# Patient Record
Sex: Male | Born: 1980
Health system: Southern US, Community
[De-identification: ages and names within clinical notes are randomized; demographics above are authoritative.]

## PROBLEM LIST (undated history)

## (undated) DIAGNOSIS — G8929 Other chronic pain: Secondary | ICD-10-CM

## (undated) DIAGNOSIS — F32A Depression, unspecified: Secondary | ICD-10-CM

## (undated) DIAGNOSIS — F419 Anxiety disorder, unspecified: Secondary | ICD-10-CM

## (undated) DIAGNOSIS — I10 Essential (primary) hypertension: Secondary | ICD-10-CM

## (undated) HISTORY — PX: BACK SURGERY: SHX140

## (undated) HISTORY — PX: TOTAL HIP ARTHROPLASTY: SHX124

---

## 2018-08-19 ENCOUNTER — Other Ambulatory Visit: Payer: Self-pay

## 2018-08-19 ENCOUNTER — Emergency Department (HOSPITAL_COMMUNITY): Payer: BC Managed Care – PPO

## 2018-08-19 ENCOUNTER — Encounter (HOSPITAL_COMMUNITY): Payer: Self-pay | Admitting: Emergency Medicine

## 2018-08-19 ENCOUNTER — Emergency Department (HOSPITAL_COMMUNITY)
Admission: EM | Admit: 2018-08-19 | Discharge: 2018-08-19 | Disposition: A | Discharge status: Home / Self Care | Payer: BC Managed Care – PPO | Attending: Emergency Medicine | Admitting: Emergency Medicine

## 2018-08-19 DIAGNOSIS — Y999 Unspecified external cause status: Secondary | ICD-10-CM | POA: Insufficient documentation

## 2018-08-19 DIAGNOSIS — S43102A Unspecified dislocation of left acromioclavicular joint, initial encounter: Secondary | ICD-10-CM

## 2018-08-19 DIAGNOSIS — W010XXA Fall on same level from slipping, tripping and stumbling without subsequent striking against object, initial encounter: Secondary | ICD-10-CM | POA: Insufficient documentation

## 2018-08-19 DIAGNOSIS — Z96643 Presence of artificial hip joint, bilateral: Secondary | ICD-10-CM | POA: Insufficient documentation

## 2018-08-19 DIAGNOSIS — S43112A Subluxation of left acromioclavicular joint, initial encounter: Secondary | ICD-10-CM | POA: Insufficient documentation

## 2018-08-19 DIAGNOSIS — Y9302 Activity, running: Secondary | ICD-10-CM | POA: Insufficient documentation

## 2018-08-19 DIAGNOSIS — Y9289 Other specified places as the place of occurrence of the external cause: Secondary | ICD-10-CM | POA: Insufficient documentation

## 2018-08-19 DIAGNOSIS — S4992XA Unspecified injury of left shoulder and upper arm, initial encounter: Secondary | ICD-10-CM | POA: Diagnosis not present

## 2018-08-19 MED ORDER — OXYCODONE-ACETAMINOPHEN 5-325 MG PO TABS
2.0000 | ORAL_TABLET | Freq: Once | ORAL | Status: AC
  Administered 2018-08-19: 2 via ORAL
  Filled 2018-08-19: qty 2

## 2018-08-19 NOTE — Discharge Instructions (Signed)
Continue your home pain medication.  Can add 800mg  motrin in between doses if needed.  Can take this up to 3x daily. Follow-up with orthopedics-- Dr. Lyla Glassing is on call so you may follow-up with him or find your own local physician. Return to the ED for new or worsening symptoms.

## 2018-08-19 NOTE — ED Triage Notes (Signed)
Pt presents after fall while running 2-3 hours ago, deformity to L shoulder.

## 2018-08-19 NOTE — ED Notes (Signed)
Pt is upset that he does not need surgery on his shoulder tonight.  Pt reports a lot of pain.  Percocet given to pt.  Pt removed IV and stated he could place a sling on at home.

## 2018-08-19 NOTE — ED Notes (Signed)
Patient verbalizes understanding of discharge instructions. Opportunity for questioning and answers were provided. Armband removed by staff, pt discharged from ED.  Pt asked for the sling he was supposed to get before leaving in the lobby.  Pt was told to wait in the lobby for ortho to come down and place sling on patient.  Pt was very upset and very loud upon discharge.

## 2018-08-19 NOTE — ED Provider Notes (Signed)
[facility] EMERGENCY DEPARTMENT Provider Note   CSN: [id number] Arrival date & time: 08/19/18  2109     History   Chief Complaint Chief Complaint  Patient presents with  . Shoulder Injury    HPI TERREN JANDREAU is a 38 y.o. male.     The history is provided by the patient and medical records.     38 year old male presenting to the ED with left shoulder injury.  He states he was at a family outing and was running and started going to fast, tripped, and landed on his left shoulder.  There was no head injury or loss of consciousness.  States his dad who is a physician tried to mess with his shoulder but it seemed to make it worse.  He feels like it is "popping out of place".  He denies any numbness or weakness of left arm.  He is right-hand dominant.  No meds taken prior to arrival.  History reviewed. No pertinent past medical history.  There are no active problems to display for this patient.   Past Surgical History:  Procedure Laterality Date  . TOTAL HIP ARTHROPLASTY Bilateral         Home Medications    Prior to Admission medications   Not on File    Family History No family history on file.  Social History Social History   Tobacco Use  . Smoking status: Never Smoker  . Smokeless tobacco: Never Used  Substance Use Topics  . Alcohol use: Not Currently  . Drug use: Not Currently    Types: Marijuana     Allergies   Patient has no known allergies.   Review of Systems Review of Systems  Musculoskeletal: Positive for arthralgias.  All other systems reviewed and are negative.    Physical Exam Updated Vital Signs BP 139/85 (BP Location: Right Arm)   Pulse 92   Temp 98.2 F (36.8 C) (Oral)   Resp 18   Ht 6\' 1"  (1.854 m)   SpO2 97%   Physical Exam Vitals signs and nursing note reviewed.  Constitutional:      Appearance: He is well-developed.  HENT:     Head: Normocephalic and atraumatic.  Eyes:     Conjunctiva/sclera:  Conjunctivae normal.     Pupils: Pupils are equal, round, and reactive to light.  Neck:     Musculoskeletal: Normal range of motion.  Cardiovascular:     Rate and Rhythm: Normal rate and regular rhythm.     Heart sounds: Normal heart sounds.  Pulmonary:     Effort: Pulmonary effort is normal.     Breath sounds: Normal breath sounds.  Abdominal:     General: Bowel sounds are normal.     Palpations: Abdomen is soft.  Musculoskeletal: Normal range of motion.     Comments: Left shoulder with deformity noted at the [facility] joint, small abrasion overlying but no open wound or skin tenting, limited ROM of shoulder, full ROM of elbow, wrist, and fingers, normal distal sensation and cap refill  Skin:    General: Skin is warm and dry.  Neurological:     Mental Status: He is alert and oriented to person, place, and time.  Psychiatric:     Comments: agitated      ED Treatments / Results  Labs (all labs ordered are listed, but only abnormal results are displayed) Labs Reviewed - No data to display  EKG None  Radiology Dg Shoulder Left  Result Date: 08/19/2018 CLINICAL  DATA:  Left shoulder pain after fall today. Dislocation. EXAM: LEFT SHOULDER - 2+ VIEW COMPARISON:  None. FINDINGS: Acromioclavicular joint separation with superior elevation of the clavicle above the superior border of the acromion. There is superior displacement of the clavicle. Widening of the cortical clavicular joint space at 27 mm. Slight anterior displacement of the humeral head with respect to the glenoid felt to be related to positioning. IMPRESSION: Acromioclavicular joint separation with widening of the cortical clavicular distance. This may be a grade 3 or 5 injury. Electronically Signed   By: Narda RutherfordMelanie  Sanford M.D.   On: 08/19/2018 23:00    Procedures Procedures (including critical care time)  Medications Ordered in ED Medications - No data to display   Initial Impression / Assessment and Plan / ED Course  I have  reviewed the triage vital signs and the nursing notes.  Pertinent labs & imaging results that were available during my care of the patient were reviewed by me and considered in my medical decision making (see chart for details).  38 year old male here with left shoulder injury.  He was at a family outing with family, was running and lost his footing and fell onto the left shoulder.  No head injury or loss of consciousness.  States he feels like it is dislocated.  His dad who is a physician tried to manipulate it but seem to make it worse.  On exam.  [facility] with small overlying abrasion but no open wound or skin tenting.  Patient continually lifting his shoulder up and moving it around during exam which seems to be making this worse.  I have advised him against this numerous times but continues doing so.  Will place in shoulder sling and have him follow-up with orthopedics.  On controlled substance database system, patient receives chronic narcotics from his primary care doctor, last filled on 07/26/2018.  In light of this will not prescribe further narcotics, have advised him to add anti-inflammatories between dosing if needed.  He was given information for orthopedic physician on call.  He can return here for any new or acute changes.  11:23 PM Patient becoming very irate while here in ED while waiting for shoulder sling.  He states this is "ludacris" and "I never should have come here" but does not state exactly what his issue is.  He seems to have some lack of understanding regarding nature of his injury so have reviewed with him again.  He continues raising his voice in the hallway at staff, ultimately stormed out of the ER with paperwork but did not wait for sling.  Final Clinical Impressions(s) / ED Diagnoses   Final diagnoses:  AC separation, left, initial encounter    ED Discharge Orders    None       Garlon HatchetSanders, Lisa M, PA-C 08/19/18 2335    Virgina Norfolkuratolo,  Adam, DO 08/19/18 2345

## 2018-08-20 DIAGNOSIS — M25512 Pain in left shoulder: Secondary | ICD-10-CM | POA: Diagnosis not present

## 2018-08-21 DIAGNOSIS — H52221 Regular astigmatism, right eye: Secondary | ICD-10-CM | POA: Diagnosis not present

## 2018-08-22 DIAGNOSIS — S43102A Unspecified dislocation of left acromioclavicular joint, initial encounter: Secondary | ICD-10-CM | POA: Diagnosis not present

## 2018-08-22 DIAGNOSIS — M25512 Pain in left shoulder: Secondary | ICD-10-CM | POA: Diagnosis not present

## 2018-09-12 DIAGNOSIS — S43102D Unspecified dislocation of left acromioclavicular joint, subsequent encounter: Secondary | ICD-10-CM | POA: Diagnosis not present

## 2018-09-12 DIAGNOSIS — S43102A Unspecified dislocation of left acromioclavicular joint, initial encounter: Secondary | ICD-10-CM | POA: Diagnosis not present

## 2018-09-28 DIAGNOSIS — F900 Attention-deficit hyperactivity disorder, predominantly inattentive type: Secondary | ICD-10-CM | POA: Diagnosis not present

## 2018-09-28 DIAGNOSIS — Z96643 Presence of artificial hip joint, bilateral: Secondary | ICD-10-CM | POA: Diagnosis not present

## 2018-09-28 DIAGNOSIS — S43122D Dislocation of left acromioclavicular joint, 100%-200% displacement, subsequent encounter: Secondary | ICD-10-CM | POA: Diagnosis not present

## 2018-09-28 DIAGNOSIS — G894 Chronic pain syndrome: Secondary | ICD-10-CM | POA: Diagnosis not present

## 2018-10-10 DIAGNOSIS — S43102A Unspecified dislocation of left acromioclavicular joint, initial encounter: Secondary | ICD-10-CM | POA: Diagnosis not present

## 2018-10-10 DIAGNOSIS — S43102D Unspecified dislocation of left acromioclavicular joint, subsequent encounter: Secondary | ICD-10-CM | POA: Diagnosis not present

## 2019-01-21 DIAGNOSIS — Z20828 Contact with and (suspected) exposure to other viral communicable diseases: Secondary | ICD-10-CM | POA: Diagnosis not present

## 2019-01-31 DIAGNOSIS — M5432 Sciatica, left side: Secondary | ICD-10-CM | POA: Diagnosis not present

## 2019-01-31 DIAGNOSIS — Z79899 Other long term (current) drug therapy: Secondary | ICD-10-CM | POA: Diagnosis not present

## 2019-01-31 DIAGNOSIS — M545 Low back pain: Secondary | ICD-10-CM | POA: Diagnosis not present

## 2019-01-31 DIAGNOSIS — M4716 Other spondylosis with myelopathy, lumbar region: Secondary | ICD-10-CM | POA: Diagnosis not present

## 2019-01-31 DIAGNOSIS — M5431 Sciatica, right side: Secondary | ICD-10-CM | POA: Diagnosis not present

## 2019-02-04 ENCOUNTER — Other Ambulatory Visit: Payer: Self-pay | Admitting: Orthopaedic Surgery

## 2019-02-04 DIAGNOSIS — M4716 Other spondylosis with myelopathy, lumbar region: Secondary | ICD-10-CM

## 2019-02-13 ENCOUNTER — Ambulatory Visit (INDEPENDENT_AMBULATORY_CARE_PROVIDER_SITE_OTHER): Payer: BC Managed Care – PPO | Admitting: Psychology

## 2019-02-13 DIAGNOSIS — F331 Major depressive disorder, recurrent, moderate: Secondary | ICD-10-CM

## 2019-02-21 DIAGNOSIS — S43122D Dislocation of left acromioclavicular joint, 100%-200% displacement, subsequent encounter: Secondary | ICD-10-CM | POA: Diagnosis not present

## 2019-02-21 DIAGNOSIS — F900 Attention-deficit hyperactivity disorder, predominantly inattentive type: Secondary | ICD-10-CM | POA: Diagnosis not present

## 2019-02-21 DIAGNOSIS — Z96643 Presence of artificial hip joint, bilateral: Secondary | ICD-10-CM | POA: Diagnosis not present

## 2019-02-21 DIAGNOSIS — G894 Chronic pain syndrome: Secondary | ICD-10-CM | POA: Diagnosis not present

## 2019-02-22 ENCOUNTER — Other Ambulatory Visit: Payer: Self-pay | Admitting: Orthopaedic Surgery

## 2019-02-26 ENCOUNTER — Other Ambulatory Visit: Payer: Self-pay

## 2019-02-26 ENCOUNTER — Ambulatory Visit
Admission: RE | Admit: 2019-02-26 | Discharge: 2019-02-26 | Disposition: A | Discharge status: Home / Self Care | Payer: BLUE CROSS/BLUE SHIELD | Source: Ambulatory Visit | Attending: Orthopaedic Surgery | Admitting: Orthopaedic Surgery

## 2019-02-26 DIAGNOSIS — M4716 Other spondylosis with myelopathy, lumbar region: Secondary | ICD-10-CM

## 2019-02-26 DIAGNOSIS — M48061 Spinal stenosis, lumbar region without neurogenic claudication: Secondary | ICD-10-CM | POA: Diagnosis not present

## 2019-02-28 ENCOUNTER — Ambulatory Visit (INDEPENDENT_AMBULATORY_CARE_PROVIDER_SITE_OTHER): Payer: BC Managed Care – PPO | Admitting: Psychology

## 2019-02-28 DIAGNOSIS — F331 Major depressive disorder, recurrent, moderate: Secondary | ICD-10-CM | POA: Diagnosis not present

## 2019-02-28 DIAGNOSIS — M47816 Spondylosis without myelopathy or radiculopathy, lumbar region: Secondary | ICD-10-CM | POA: Diagnosis not present

## 2019-02-28 DIAGNOSIS — M48061 Spinal stenosis, lumbar region without neurogenic claudication: Secondary | ICD-10-CM | POA: Diagnosis not present

## 2019-03-06 ENCOUNTER — Ambulatory Visit (INDEPENDENT_AMBULATORY_CARE_PROVIDER_SITE_OTHER): Payer: BC Managed Care – PPO | Admitting: Psychology

## 2019-03-06 DIAGNOSIS — F331 Major depressive disorder, recurrent, moderate: Secondary | ICD-10-CM

## 2019-03-18 DIAGNOSIS — M47816 Spondylosis without myelopathy or radiculopathy, lumbar region: Secondary | ICD-10-CM | POA: Diagnosis not present

## 2019-03-26 DIAGNOSIS — M48061 Spinal stenosis, lumbar region without neurogenic claudication: Secondary | ICD-10-CM | POA: Diagnosis not present

## 2019-03-27 ENCOUNTER — Ambulatory Visit (INDEPENDENT_AMBULATORY_CARE_PROVIDER_SITE_OTHER): Payer: BC Managed Care – PPO | Admitting: Psychology

## 2019-03-27 DIAGNOSIS — F331 Major depressive disorder, recurrent, moderate: Secondary | ICD-10-CM

## 2019-04-16 DIAGNOSIS — M48061 Spinal stenosis, lumbar region without neurogenic claudication: Secondary | ICD-10-CM | POA: Diagnosis not present

## 2019-04-16 DIAGNOSIS — M47816 Spondylosis without myelopathy or radiculopathy, lumbar region: Secondary | ICD-10-CM | POA: Diagnosis not present

## 2019-04-16 DIAGNOSIS — Z6835 Body mass index (BMI) 35.0-35.9, adult: Secondary | ICD-10-CM | POA: Diagnosis not present

## 2019-04-23 ENCOUNTER — Ambulatory Visit: Payer: BC Managed Care – PPO | Admitting: Psychology

## 2019-05-02 DIAGNOSIS — Z79899 Other long term (current) drug therapy: Secondary | ICD-10-CM | POA: Diagnosis not present

## 2019-05-02 DIAGNOSIS — M48062 Spinal stenosis, lumbar region with neurogenic claudication: Secondary | ICD-10-CM | POA: Diagnosis not present

## 2019-05-02 DIAGNOSIS — M545 Low back pain: Secondary | ICD-10-CM | POA: Diagnosis not present

## 2019-05-02 DIAGNOSIS — Z1339 Encounter for screening examination for other mental health and behavioral disorders: Secondary | ICD-10-CM | POA: Diagnosis not present

## 2019-05-02 DIAGNOSIS — M129 Arthropathy, unspecified: Secondary | ICD-10-CM | POA: Diagnosis not present

## 2019-05-02 DIAGNOSIS — Z1331 Encounter for screening for depression: Secondary | ICD-10-CM | POA: Diagnosis not present

## 2019-05-09 ENCOUNTER — Ambulatory Visit (INDEPENDENT_AMBULATORY_CARE_PROVIDER_SITE_OTHER): Payer: BC Managed Care – PPO | Admitting: Psychology

## 2019-05-09 DIAGNOSIS — F331 Major depressive disorder, recurrent, moderate: Secondary | ICD-10-CM | POA: Diagnosis not present

## 2019-05-28 DIAGNOSIS — M48061 Spinal stenosis, lumbar region without neurogenic claudication: Secondary | ICD-10-CM | POA: Diagnosis not present

## 2019-05-30 DIAGNOSIS — Z79899 Other long term (current) drug therapy: Secondary | ICD-10-CM | POA: Diagnosis not present

## 2019-05-30 DIAGNOSIS — Z0000 Encounter for general adult medical examination without abnormal findings: Secondary | ICD-10-CM | POA: Diagnosis not present

## 2019-05-30 DIAGNOSIS — Z131 Encounter for screening for diabetes mellitus: Secondary | ICD-10-CM | POA: Diagnosis not present

## 2019-05-30 DIAGNOSIS — M48062 Spinal stenosis, lumbar region with neurogenic claudication: Secondary | ICD-10-CM | POA: Diagnosis not present

## 2019-05-30 DIAGNOSIS — Z114 Encounter for screening for human immunodeficiency virus [HIV]: Secondary | ICD-10-CM | POA: Diagnosis not present

## 2019-05-30 DIAGNOSIS — M545 Low back pain: Secondary | ICD-10-CM | POA: Diagnosis not present

## 2019-05-30 DIAGNOSIS — F411 Generalized anxiety disorder: Secondary | ICD-10-CM | POA: Diagnosis not present

## 2019-05-30 DIAGNOSIS — D539 Nutritional anemia, unspecified: Secondary | ICD-10-CM | POA: Diagnosis not present

## 2019-05-30 DIAGNOSIS — R5383 Other fatigue: Secondary | ICD-10-CM | POA: Diagnosis not present

## 2019-05-30 DIAGNOSIS — Z20822 Contact with and (suspected) exposure to covid-19: Secondary | ICD-10-CM | POA: Diagnosis not present

## 2019-06-19 ENCOUNTER — Ambulatory Visit (INDEPENDENT_AMBULATORY_CARE_PROVIDER_SITE_OTHER): Payer: BC Managed Care – PPO | Admitting: Psychology

## 2019-06-19 DIAGNOSIS — F331 Major depressive disorder, recurrent, moderate: Secondary | ICD-10-CM

## 2019-06-25 DIAGNOSIS — M5432 Sciatica, left side: Secondary | ICD-10-CM | POA: Diagnosis not present

## 2019-06-25 DIAGNOSIS — M48061 Spinal stenosis, lumbar region without neurogenic claudication: Secondary | ICD-10-CM | POA: Diagnosis not present

## 2019-06-25 DIAGNOSIS — M545 Low back pain: Secondary | ICD-10-CM | POA: Diagnosis not present

## 2019-06-25 DIAGNOSIS — M5431 Sciatica, right side: Secondary | ICD-10-CM | POA: Diagnosis not present

## 2019-07-08 DIAGNOSIS — M5432 Sciatica, left side: Secondary | ICD-10-CM | POA: Diagnosis not present

## 2019-07-17 DIAGNOSIS — M48062 Spinal stenosis, lumbar region with neurogenic claudication: Secondary | ICD-10-CM | POA: Diagnosis not present

## 2019-07-17 DIAGNOSIS — E291 Testicular hypofunction: Secondary | ICD-10-CM | POA: Diagnosis not present

## 2019-07-17 DIAGNOSIS — Z79899 Other long term (current) drug therapy: Secondary | ICD-10-CM | POA: Diagnosis not present

## 2019-07-17 DIAGNOSIS — M545 Low back pain: Secondary | ICD-10-CM | POA: Diagnosis not present

## 2019-08-05 DIAGNOSIS — M5432 Sciatica, left side: Secondary | ICD-10-CM | POA: Diagnosis not present

## 2019-08-05 DIAGNOSIS — M48061 Spinal stenosis, lumbar region without neurogenic claudication: Secondary | ICD-10-CM | POA: Diagnosis not present

## 2019-08-05 DIAGNOSIS — M545 Low back pain: Secondary | ICD-10-CM | POA: Diagnosis not present

## 2019-08-21 DIAGNOSIS — F39 Unspecified mood [affective] disorder: Secondary | ICD-10-CM | POA: Diagnosis not present

## 2019-08-21 DIAGNOSIS — M4800 Spinal stenosis, site unspecified: Secondary | ICD-10-CM | POA: Diagnosis not present

## 2019-08-21 DIAGNOSIS — E669 Obesity, unspecified: Secondary | ICD-10-CM | POA: Diagnosis not present

## 2019-08-21 DIAGNOSIS — F419 Anxiety disorder, unspecified: Secondary | ICD-10-CM | POA: Diagnosis not present

## 2019-08-26 DIAGNOSIS — I10 Essential (primary) hypertension: Secondary | ICD-10-CM | POA: Diagnosis not present

## 2019-08-26 DIAGNOSIS — Z79899 Other long term (current) drug therapy: Secondary | ICD-10-CM | POA: Diagnosis not present

## 2019-08-26 DIAGNOSIS — M545 Low back pain: Secondary | ICD-10-CM | POA: Diagnosis not present

## 2019-08-26 DIAGNOSIS — M48061 Spinal stenosis, lumbar region without neurogenic claudication: Secondary | ICD-10-CM | POA: Diagnosis not present

## 2019-08-26 DIAGNOSIS — Z6835 Body mass index (BMI) 35.0-35.9, adult: Secondary | ICD-10-CM | POA: Diagnosis not present

## 2019-08-26 DIAGNOSIS — M5432 Sciatica, left side: Secondary | ICD-10-CM | POA: Diagnosis not present

## 2019-09-20 DIAGNOSIS — M545 Low back pain: Secondary | ICD-10-CM | POA: Diagnosis not present

## 2019-09-23 DIAGNOSIS — Z6835 Body mass index (BMI) 35.0-35.9, adult: Secondary | ICD-10-CM | POA: Diagnosis not present

## 2019-09-23 DIAGNOSIS — M48061 Spinal stenosis, lumbar region without neurogenic claudication: Secondary | ICD-10-CM | POA: Diagnosis not present

## 2019-09-23 DIAGNOSIS — M5432 Sciatica, left side: Secondary | ICD-10-CM | POA: Diagnosis not present

## 2019-09-23 DIAGNOSIS — M545 Low back pain: Secondary | ICD-10-CM | POA: Diagnosis not present

## 2019-09-30 DIAGNOSIS — M48061 Spinal stenosis, lumbar region without neurogenic claudication: Secondary | ICD-10-CM | POA: Diagnosis not present

## 2019-10-01 ENCOUNTER — Ambulatory Visit: Payer: BC Managed Care – PPO | Admitting: Psychology

## 2019-10-17 DIAGNOSIS — F900 Attention-deficit hyperactivity disorder, predominantly inattentive type: Secondary | ICD-10-CM | POA: Diagnosis not present

## 2019-10-17 DIAGNOSIS — S43122D Dislocation of left acromioclavicular joint, 100%-200% displacement, subsequent encounter: Secondary | ICD-10-CM | POA: Diagnosis not present

## 2019-10-17 DIAGNOSIS — G894 Chronic pain syndrome: Secondary | ICD-10-CM | POA: Diagnosis not present

## 2019-10-17 DIAGNOSIS — I10 Essential (primary) hypertension: Secondary | ICD-10-CM | POA: Diagnosis not present

## 2019-10-24 DIAGNOSIS — M545 Low back pain: Secondary | ICD-10-CM | POA: Diagnosis not present

## 2019-10-24 DIAGNOSIS — Z6835 Body mass index (BMI) 35.0-35.9, adult: Secondary | ICD-10-CM | POA: Diagnosis not present

## 2019-10-24 DIAGNOSIS — Z4689 Encounter for fitting and adjustment of other specified devices: Secondary | ICD-10-CM | POA: Diagnosis not present

## 2019-10-24 DIAGNOSIS — M5432 Sciatica, left side: Secondary | ICD-10-CM | POA: Diagnosis not present

## 2019-10-24 DIAGNOSIS — M48061 Spinal stenosis, lumbar region without neurogenic claudication: Secondary | ICD-10-CM | POA: Diagnosis not present

## 2019-10-24 DIAGNOSIS — M4716 Other spondylosis with myelopathy, lumbar region: Secondary | ICD-10-CM | POA: Diagnosis not present

## 2019-11-01 DIAGNOSIS — E669 Obesity, unspecified: Secondary | ICD-10-CM | POA: Diagnosis not present

## 2019-11-01 DIAGNOSIS — Z23 Encounter for immunization: Secondary | ICD-10-CM | POA: Diagnosis not present

## 2019-11-01 DIAGNOSIS — M4800 Spinal stenosis, site unspecified: Secondary | ICD-10-CM | POA: Diagnosis not present

## 2019-11-01 DIAGNOSIS — Z01818 Encounter for other preprocedural examination: Secondary | ICD-10-CM | POA: Diagnosis not present

## 2019-11-21 DIAGNOSIS — M4316 Spondylolisthesis, lumbar region: Secondary | ICD-10-CM | POA: Diagnosis not present

## 2019-11-21 DIAGNOSIS — Z6835 Body mass index (BMI) 35.0-35.9, adult: Secondary | ICD-10-CM | POA: Diagnosis not present

## 2019-11-21 DIAGNOSIS — M48061 Spinal stenosis, lumbar region without neurogenic claudication: Secondary | ICD-10-CM | POA: Diagnosis not present

## 2019-11-21 DIAGNOSIS — M5432 Sciatica, left side: Secondary | ICD-10-CM | POA: Diagnosis not present

## 2019-11-21 DIAGNOSIS — M545 Low back pain, unspecified: Secondary | ICD-10-CM | POA: Diagnosis not present

## 2019-12-03 DIAGNOSIS — M5106 Intervertebral disc disorders with myelopathy, lumbar region: Secondary | ICD-10-CM | POA: Diagnosis not present

## 2019-12-03 DIAGNOSIS — M4716 Other spondylosis with myelopathy, lumbar region: Secondary | ICD-10-CM | POA: Diagnosis not present

## 2019-12-03 DIAGNOSIS — M961 Postlaminectomy syndrome, not elsewhere classified: Secondary | ICD-10-CM | POA: Diagnosis not present

## 2019-12-03 DIAGNOSIS — M48061 Spinal stenosis, lumbar region without neurogenic claudication: Secondary | ICD-10-CM | POA: Diagnosis not present

## 2019-12-03 DIAGNOSIS — M4316 Spondylolisthesis, lumbar region: Secondary | ICD-10-CM | POA: Diagnosis not present

## 2019-12-06 DIAGNOSIS — M5106 Intervertebral disc disorders with myelopathy, lumbar region: Secondary | ICD-10-CM | POA: Diagnosis not present

## 2019-12-06 DIAGNOSIS — M4316 Spondylolisthesis, lumbar region: Secondary | ICD-10-CM | POA: Diagnosis not present

## 2019-12-06 DIAGNOSIS — Z01812 Encounter for preprocedural laboratory examination: Secondary | ICD-10-CM | POA: Diagnosis not present

## 2019-12-06 DIAGNOSIS — M4716 Other spondylosis with myelopathy, lumbar region: Secondary | ICD-10-CM | POA: Diagnosis not present

## 2019-12-06 DIAGNOSIS — M961 Postlaminectomy syndrome, not elsewhere classified: Secondary | ICD-10-CM | POA: Diagnosis not present

## 2019-12-10 DIAGNOSIS — M4716 Other spondylosis with myelopathy, lumbar region: Secondary | ICD-10-CM | POA: Diagnosis not present

## 2019-12-10 DIAGNOSIS — M5106 Intervertebral disc disorders with myelopathy, lumbar region: Secondary | ICD-10-CM | POA: Diagnosis not present

## 2019-12-10 DIAGNOSIS — M4807 Spinal stenosis, lumbosacral region: Secondary | ICD-10-CM | POA: Diagnosis not present

## 2019-12-10 DIAGNOSIS — M5136 Other intervertebral disc degeneration, lumbar region: Secondary | ICD-10-CM | POA: Diagnosis not present

## 2019-12-10 DIAGNOSIS — M48061 Spinal stenosis, lumbar region without neurogenic claudication: Secondary | ICD-10-CM | POA: Diagnosis not present

## 2019-12-10 DIAGNOSIS — M4727 Other spondylosis with radiculopathy, lumbosacral region: Secondary | ICD-10-CM | POA: Diagnosis not present

## 2019-12-10 DIAGNOSIS — M4316 Spondylolisthesis, lumbar region: Secondary | ICD-10-CM | POA: Diagnosis not present

## 2019-12-10 DIAGNOSIS — Z981 Arthrodesis status: Secondary | ICD-10-CM | POA: Diagnosis not present

## 2019-12-10 DIAGNOSIS — M5127 Other intervertebral disc displacement, lumbosacral region: Secondary | ICD-10-CM | POA: Diagnosis not present

## 2019-12-10 DIAGNOSIS — G8918 Other acute postprocedural pain: Secondary | ICD-10-CM | POA: Diagnosis not present

## 2019-12-10 DIAGNOSIS — M4317 Spondylolisthesis, lumbosacral region: Secondary | ICD-10-CM | POA: Diagnosis not present

## 2019-12-10 DIAGNOSIS — M961 Postlaminectomy syndrome, not elsewhere classified: Secondary | ICD-10-CM | POA: Diagnosis not present

## 2019-12-11 DIAGNOSIS — M5127 Other intervertebral disc displacement, lumbosacral region: Secondary | ICD-10-CM | POA: Diagnosis not present

## 2019-12-11 DIAGNOSIS — M4326 Fusion of spine, lumbar region: Secondary | ICD-10-CM | POA: Diagnosis not present

## 2019-12-11 DIAGNOSIS — M4807 Spinal stenosis, lumbosacral region: Secondary | ICD-10-CM | POA: Diagnosis not present

## 2019-12-11 DIAGNOSIS — M4317 Spondylolisthesis, lumbosacral region: Secondary | ICD-10-CM | POA: Diagnosis not present

## 2019-12-11 DIAGNOSIS — R531 Weakness: Secondary | ICD-10-CM | POA: Diagnosis not present

## 2019-12-11 DIAGNOSIS — M4316 Spondylolisthesis, lumbar region: Secondary | ICD-10-CM | POA: Diagnosis not present

## 2019-12-11 DIAGNOSIS — M4727 Other spondylosis with radiculopathy, lumbosacral region: Secondary | ICD-10-CM | POA: Diagnosis not present

## 2019-12-11 DIAGNOSIS — M961 Postlaminectomy syndrome, not elsewhere classified: Secondary | ICD-10-CM | POA: Diagnosis not present

## 2019-12-11 DIAGNOSIS — Z981 Arthrodesis status: Secondary | ICD-10-CM | POA: Diagnosis not present

## 2020-01-08 DIAGNOSIS — M4316 Spondylolisthesis, lumbar region: Secondary | ICD-10-CM | POA: Diagnosis not present

## 2020-01-20 DIAGNOSIS — Z0000 Encounter for general adult medical examination without abnormal findings: Secondary | ICD-10-CM | POA: Diagnosis not present

## 2020-02-05 DIAGNOSIS — M4316 Spondylolisthesis, lumbar region: Secondary | ICD-10-CM | POA: Diagnosis not present

## 2020-02-05 DIAGNOSIS — Z981 Arthrodesis status: Secondary | ICD-10-CM | POA: Diagnosis not present

## 2020-02-18 DIAGNOSIS — Z79891 Long term (current) use of opiate analgesic: Secondary | ICD-10-CM | POA: Diagnosis not present

## 2020-02-18 DIAGNOSIS — M4326 Fusion of spine, lumbar region: Secondary | ICD-10-CM | POA: Diagnosis not present

## 2020-02-18 DIAGNOSIS — Z79899 Other long term (current) drug therapy: Secondary | ICD-10-CM | POA: Diagnosis not present

## 2020-02-18 DIAGNOSIS — G894 Chronic pain syndrome: Secondary | ICD-10-CM | POA: Diagnosis not present

## 2020-03-18 DIAGNOSIS — M545 Low back pain, unspecified: Secondary | ICD-10-CM | POA: Diagnosis not present

## 2020-03-18 DIAGNOSIS — M4716 Other spondylosis with myelopathy, lumbar region: Secondary | ICD-10-CM | POA: Diagnosis not present

## 2020-03-18 DIAGNOSIS — M4326 Fusion of spine, lumbar region: Secondary | ICD-10-CM | POA: Diagnosis not present

## 2020-04-03 DIAGNOSIS — E291 Testicular hypofunction: Secondary | ICD-10-CM | POA: Diagnosis not present

## 2020-04-16 DIAGNOSIS — M4326 Fusion of spine, lumbar region: Secondary | ICD-10-CM | POA: Diagnosis not present

## 2020-04-16 DIAGNOSIS — M545 Low back pain, unspecified: Secondary | ICD-10-CM | POA: Diagnosis not present

## 2020-05-14 DIAGNOSIS — M4326 Fusion of spine, lumbar region: Secondary | ICD-10-CM | POA: Diagnosis not present

## 2020-05-14 DIAGNOSIS — M545 Low back pain, unspecified: Secondary | ICD-10-CM | POA: Diagnosis not present

## 2020-06-16 DIAGNOSIS — M545 Low back pain, unspecified: Secondary | ICD-10-CM | POA: Diagnosis not present

## 2020-06-16 DIAGNOSIS — Z6835 Body mass index (BMI) 35.0-35.9, adult: Secondary | ICD-10-CM | POA: Diagnosis not present

## 2020-06-16 DIAGNOSIS — M4326 Fusion of spine, lumbar region: Secondary | ICD-10-CM | POA: Diagnosis not present

## 2020-07-07 DIAGNOSIS — R5383 Other fatigue: Secondary | ICD-10-CM | POA: Diagnosis not present

## 2020-07-07 DIAGNOSIS — E291 Testicular hypofunction: Secondary | ICD-10-CM | POA: Diagnosis not present

## 2020-07-20 DIAGNOSIS — M4326 Fusion of spine, lumbar region: Secondary | ICD-10-CM | POA: Diagnosis not present

## 2020-07-20 DIAGNOSIS — M545 Low back pain, unspecified: Secondary | ICD-10-CM | POA: Diagnosis not present

## 2020-07-20 DIAGNOSIS — I10 Essential (primary) hypertension: Secondary | ICD-10-CM | POA: Diagnosis not present

## 2020-07-20 DIAGNOSIS — Z6835 Body mass index (BMI) 35.0-35.9, adult: Secondary | ICD-10-CM | POA: Diagnosis not present

## 2020-07-24 DIAGNOSIS — M4800 Spinal stenosis, site unspecified: Secondary | ICD-10-CM | POA: Diagnosis not present

## 2020-08-13 ENCOUNTER — Ambulatory Visit: Payer: BC Managed Care – PPO | Admitting: Behavioral Health

## 2020-08-18 DIAGNOSIS — M4326 Fusion of spine, lumbar region: Secondary | ICD-10-CM | POA: Diagnosis not present

## 2020-08-18 DIAGNOSIS — M545 Low back pain, unspecified: Secondary | ICD-10-CM | POA: Diagnosis not present

## 2020-08-18 DIAGNOSIS — Z79899 Other long term (current) drug therapy: Secondary | ICD-10-CM | POA: Diagnosis not present

## 2020-08-27 ENCOUNTER — Encounter: Payer: Self-pay | Admitting: Behavioral Health

## 2020-08-27 ENCOUNTER — Ambulatory Visit (INDEPENDENT_AMBULATORY_CARE_PROVIDER_SITE_OTHER): Payer: BC Managed Care – PPO | Admitting: Behavioral Health

## 2020-08-27 ENCOUNTER — Other Ambulatory Visit: Payer: Self-pay

## 2020-08-27 VITALS — BP 151/87 | HR 92 | Ht 73.0 in | Wt 254.0 lb

## 2020-08-27 DIAGNOSIS — F902 Attention-deficit hyperactivity disorder, combined type: Secondary | ICD-10-CM | POA: Diagnosis not present

## 2020-08-27 DIAGNOSIS — F319 Bipolar disorder, unspecified: Secondary | ICD-10-CM

## 2020-08-27 DIAGNOSIS — F331 Major depressive disorder, recurrent, moderate: Secondary | ICD-10-CM

## 2020-08-27 DIAGNOSIS — F411 Generalized anxiety disorder: Secondary | ICD-10-CM | POA: Diagnosis not present

## 2020-08-27 MED ORDER — CARIPRAZINE HCL 3 MG PO CAPS: 3.0000 mg | ORAL_CAPSULE | Freq: Every day | ORAL | 1 refills | Status: DC

## 2020-08-27 MED ORDER — AMPHETAMINE-DEXTROAMPHET ER 30 MG PO CP24: 30.0000 mg | ORAL_CAPSULE | Freq: Every day | ORAL | 0 refills | Status: DC

## 2020-08-27 MED ORDER — CLONIDINE HCL 0.1 MG PO TABS: 0.1000 mg | ORAL_TABLET | Freq: Two times a day (BID) | ORAL | 3 refills | Status: DC

## 2020-08-27 MED ORDER — AMPHETAMINE-DEXTROAMPHETAMINE 20 MG PO TABS: 20.0000 mg | ORAL_TABLET | Freq: Every day | ORAL | 0 refills | Status: DC

## 2020-08-27 NOTE — Progress Notes (Addendum)
[facility] MD/PA/NP Initial Note  08/28/2020 12:24 AM Oscar Weber  MRN:  [id number]  Chief Complaint:  Chief Complaint   ADHD; Establish Care; Anxiety; Depression; Medication Refill; Medication Problem; Patient Education     HPI:  40 year old male presents to this office for initial visit and to establish care. His spouse is present with his consent. He says that he is former patient of Dr. Alyse Low in [place]. Says that he has struggled with ADHD, mood disorder, and anxiety for over 20 years. His wife says that he is better when he takes his medication correctly and says Depakote has worked to some degree with his mood swings. He says that he stays more depressed than manice episodes. He experiences manic episodes cycling about every 3 months. When he becomes manic he says that after about 1 week,  he eventually crashes back in to depression, and wants to sleep all the time. He says that he has tried many different medications over the years and none have worked well for long periods. He says that he also was written Klonopin 0.5 mg 4 times daily, along with Ativan 1 mg  4 times daily. He says that he is addicted to it and knows that he has developed tolerance. Says he does not want to go through withdrawal though. He would like to try new medication to help with his depressive episode and anxiety. Once more stable he agrees to wean off some medications. He reports anxiety today at 6/10 and depression 5/10. Says he sleeps 7-8 hours per night with assistance of medications. No current mania, no psychosis. No SI/HI  Past psychiatric medication trials: Zoloft Prozac Celexa Lexapro Paxil Wellbutrin Remeron Seroquel Geodon Depakote     Visit Diagnosis:    ICD-10-CM   1. Bipolar I disorder (HCC)  F31.9     2. Attention deficit hyperactivity disorder (ADHD), combined type  F90.2 amphetamine-dextroamphetamine (ADDERALL XR) 30 MG 24 hr capsule    cloNIDine (CATAPRES) 0.1 MG  tablet    amphetamine-dextroamphetamine (ADDERALL) 20 MG tablet    3. Generalized anxiety disorder  F41.1 cloNIDine (CATAPRES) 0.1 MG tablet    cariprazine (VRAYLAR) 3 MG capsule    4. Major depressive disorder, recurrent episode, moderate (HCC)  F33.1 cariprazine (VRAYLAR) 3 MG capsule    5. Bipolar depression (HCC)  F31.9 cariprazine (VRAYLAR) 3 MG capsule      Past Psychiatric History:   Past Medical History: History reviewed. No pertinent past medical history.  Past Surgical History:  Procedure Laterality Date   TOTAL HIP ARTHROPLASTY Bilateral     Family Psychiatric History:None noted this visit  Family History: History reviewed. No pertinent family history.  Social History:  Social History   Socioeconomic History   Marital status: Married    Spouse name: Not on file   Number of children: Not on file   Years of education: Not on file   Highest education level: Not on file  Occupational History    Comment: [employer]  Tobacco Use   Smoking status: Never   Smokeless tobacco: Never  Vaping Use   Vaping Use: Never used  Substance and Sexual Activity   Alcohol use: Not Currently    Comment: rarely   Drug use: Yes    Types: Marijuana   Sexual activity: Yes    Comment: married  Other Topics Concern   Not on file  Social History Narrative   Lives with wife in [place]. They have one daughter at home.  Social Determinants of Health   Financial Resource Strain: Not on file  Food Insecurity: Not on file  Transportation Needs: Not on file  Physical Activity: Not on file  Stress: Not on file  Social Connections: Not on file    Allergies: Not on File  Metabolic Disorder Labs: No results found for: HGBA1C, MPG No results found for: PROLACTIN No results found for: CHOL, TRIG, HDL, CHOLHDL, VLDL, LDLCALC No results found for: TSH  Therapeutic Level Labs: No results found for: LITHIUM No results found for: VALPROATE No components found for:   CBMZ  Current Medications: Current Outpatient Medications  Medication Sig Dispense Refill   albuterol (VENTOLIN HFA) 108 (90 Base) MCG/ACT inhaler Inhale into the lungs.     amphetamine-dextroamphetamine (ADDERALL XR) 30 MG 24 hr capsule Take 1 capsule (30 mg total) by mouth daily. 30 capsule 0   amphetamine-dextroamphetamine (ADDERALL) 20 MG tablet Take 1 tablet (20 mg total) by mouth daily. 30 tablet 0   cariprazine (VRAYLAR) 3 MG capsule Take 1 capsule (3 mg total) by mouth daily. 30 capsule 1   cloNIDine (CATAPRES) 0.1 MG tablet Take 1 tablet (0.1 mg total) by mouth 2 (two) times daily. 60 tablet 3   amphetamine-dextroamphetamine (ADDERALL XR) 30 MG 24 hr capsule Take 30 mg by mouth every morning.     amphetamine-dextroamphetamine (ADDERALL) 20 MG tablet Take by mouth.     celecoxib (CELEBREX) 200 MG capsule Take 200 mg by mouth 2 (two) times daily as needed.     clonazePAM (KLONOPIN) 0.5 MG tablet Take 0.5 mg by mouth 4 (four) times daily.     divalproex (DEPAKOTE ER) 500 MG 24 hr tablet Take 1,500 mg by mouth at bedtime.     gabapentin (NEURONTIN) 300 MG capsule SMARTSIG:2-3 Capsule(s) By Mouth 3 Times Daily     oxyCODONE-acetaminophen (PERCOCET) 10-325 MG tablet Take 1 tablet by mouth every 6 (six) hours as needed.     sertraline (ZOLOFT) 100 MG tablet Take 200 mg by mouth every morning.     triamcinolone cream (KENALOG) 0.1 % Apply topically 2 (two) times daily.     No current facility-administered medications for this visit.    Medication Side Effects: none  Orders placed this visit:  No orders of the defined types were placed in this encounter.   Psychiatric Specialty Exam:  Review of Systems  Constitutional:  Positive for diaphoresis and fatigue.  Eyes:  Positive for visual disturbance.  Endocrine: Positive for polydipsia.  Musculoskeletal:  Positive for back pain, joint swelling, myalgias and neck pain.  Allergic/Immunologic: Positive for environmental allergies.   Hematological:  Bruises/bleeds easily.   Blood pressure (!) 151/87, pulse 92, height 6\' 1"  (1.854 m), weight 254 lb (115.2 kg).Body mass index is 33.51 kg/m.  General Appearance: Casual and Neat  Eye Contact:  Fair  Speech:  Clear and Coherent and Pressured  Volume:  Normal  Mood:  Anxious, Depressed, and Dysphoric  Affect:  Appropriate, Restricted, and Tearful  Thought Process:  Coherent  Orientation:  Full (Time, Place, and Person)  Thought Content: Logical   Suicidal Thoughts:  No  Homicidal Thoughts:  No  Memory:  WNL  Judgement:  Fair  Insight:  Fair  Psychomotor Activity:  Increased  Concentration:  Concentration: Fair  Recall:  Good  Fund of Knowledge: Fair  Language: Fair  Assets:  Desire for Improvement  ADL's:  Intact  Cognition: WNL  Prognosis:  Fair   Screenings:   Visit from 08/27/2020 in [facility]  PHQ-2 Total Score 1       Receiving Psychotherapy: No   Treatment Plan/Recommendations:  Start Vraylar 1.5 mg tablet for 14 days, then 3 mg tablet daily. Stop Seroquel 25 mg at bedtime To continue Adderall 30 mg XR  daily Continue Adderall 20 mg IR daily in afternoon Continue Clonidine 0.1 mg twice daily Continue Klonopin 0.5 mg 4 times daily Continue Depakote  1500 mg at bedtime Continue Gabapentin 300 mg three times daily Greater than 50% of  60 min face to face time with patient was spent on counseling and coordination of care. We discussed his long history of bipolar 1  disorder along with bipolar depression. Also discussed his long term use of benzodiazapine's and duplicate therapy. Pt was prescribed Klonopin and Ativan multiple doses per day. I explained that I was not comfortable with his previous scripts for both of these meds. Discussed developing plan once stabilized to wean down or completely off benzo's. Pt has been prescribed for over 20 years and has developed tolerance. Weaning will have to be  conducted slowly and safely.  Reinforced potential benefits, risk, and side effects of benzodiazepines to include potential risk of tolerance and dependence, as well as possible drowsiness.  Advised patient not to drive if experiencing drowsiness and to take lowest possible effective dose to minimize risk of dependence and tolerance.  Discussed potential benefits, risks, and side effects of stimulants with patient to include increased heart rate, palpitations, insomnia, increased anxiety, increased irritability, or decreased appetite.  Instructed patient to contact office if experiencing any significant tolerability issues.  Discussed potential metabolic side effects associated with atypical antipsychotics, as well as potential risk for movement side effects. Advised pt to contact office if movement side effects occur.   Provide Samples Vraylar  # 14 1.5 mg tablets #14 3 mg tablets.           Joan Flores, NP

## 2020-08-28 ENCOUNTER — Encounter: Payer: Self-pay | Admitting: Behavioral Health

## 2020-09-14 DIAGNOSIS — Z79891 Long term (current) use of opiate analgesic: Secondary | ICD-10-CM | POA: Diagnosis not present

## 2020-09-14 DIAGNOSIS — Z79899 Other long term (current) drug therapy: Secondary | ICD-10-CM | POA: Diagnosis not present

## 2020-09-14 DIAGNOSIS — M961 Postlaminectomy syndrome, not elsewhere classified: Secondary | ICD-10-CM | POA: Diagnosis not present

## 2020-09-14 DIAGNOSIS — M5136 Other intervertebral disc degeneration, lumbar region: Secondary | ICD-10-CM | POA: Diagnosis not present

## 2020-09-14 DIAGNOSIS — M25512 Pain in left shoulder: Secondary | ICD-10-CM | POA: Diagnosis not present

## 2020-09-14 DIAGNOSIS — G894 Chronic pain syndrome: Secondary | ICD-10-CM | POA: Diagnosis not present

## 2020-09-18 ENCOUNTER — Telehealth: Payer: Self-pay

## 2020-09-18 NOTE — Telephone Encounter (Signed)
Prior Authorization submitted and approved for VRAYLAR 3 MG effective 09/18/2020-09/17/2021 with BCBS ID# [id number]

## 2020-09-24 ENCOUNTER — Other Ambulatory Visit: Payer: Self-pay

## 2020-09-24 ENCOUNTER — Ambulatory Visit (INDEPENDENT_AMBULATORY_CARE_PROVIDER_SITE_OTHER): Payer: BC Managed Care – PPO | Admitting: Behavioral Health

## 2020-09-24 ENCOUNTER — Encounter: Payer: Self-pay | Admitting: Behavioral Health

## 2020-09-24 DIAGNOSIS — F411 Generalized anxiety disorder: Secondary | ICD-10-CM | POA: Diagnosis not present

## 2020-09-24 DIAGNOSIS — R5382 Chronic fatigue, unspecified: Secondary | ICD-10-CM

## 2020-09-24 DIAGNOSIS — F331 Major depressive disorder, recurrent, moderate: Secondary | ICD-10-CM

## 2020-09-24 DIAGNOSIS — F902 Attention-deficit hyperactivity disorder, combined type: Secondary | ICD-10-CM | POA: Diagnosis not present

## 2020-09-24 DIAGNOSIS — F319 Bipolar disorder, unspecified: Secondary | ICD-10-CM

## 2020-09-24 DIAGNOSIS — I10 Essential (primary) hypertension: Secondary | ICD-10-CM | POA: Diagnosis not present

## 2020-09-24 DIAGNOSIS — Z79899 Other long term (current) drug therapy: Secondary | ICD-10-CM

## 2020-09-24 DIAGNOSIS — E291 Testicular hypofunction: Secondary | ICD-10-CM | POA: Diagnosis not present

## 2020-09-24 MED ORDER — CARIPRAZINE HCL 3 MG PO CAPS: 3.0000 mg | ORAL_CAPSULE | Freq: Every day | ORAL | 3 refills | Status: DC

## 2020-09-24 NOTE — Progress Notes (Signed)
[facility] Med Check  Patient ID: JOCOB DAMBACH,  MRN: [id number]  PCP: Charlane Ferretti, DO  Date of Evaluation: 09/24/2020 Time spent:30 minutes  Chief Complaint:  Chief Complaint   Anxiety; Follow-up; Medication Refill; Manic Behavior     HISTORY/CURRENT STATUS: HPI 40 year old male presents to this office for follow up and medication management. He says that he is feeling so much better with the Vraylar 3 mg. Says that even though he has some irritability and agitation, he feels much more balanced and controlled. He says that his wife also sees the  improvement with moods. He says that he feels his thinking is more clear and he does not feel as sluggish. Says he would like to continue to wean off Depakote and Ativan as progress permits.  He reports his anxiety today is 3/10 and depression 3/10. He is sleeping 7-8 hours per night. No mania present, no psychosis. No SI/HI.  Past psychiatric medication trials: Zoloft Prozac Celexa Lexapro Paxil Wellbutrin Remeron Seroquel Geodon Depakote     Individual Medical History/ Review of Systems: Changes? :No   Allergies: Patient has no allergy information on record.  Current Medications:  Current Outpatient Medications:    albuterol (VENTOLIN HFA) 108 (90 Base) MCG/ACT inhaler, Inhale into the lungs., Disp: , Rfl:    amphetamine-dextroamphetamine (ADDERALL XR) 30 MG 24 hr capsule, Take 30 mg by mouth every morning., Disp: , Rfl:    amphetamine-dextroamphetamine (ADDERALL XR) 30 MG 24 hr capsule, Take 1 capsule (30 mg total) by mouth daily., Disp: 30 capsule, Rfl: 0   amphetamine-dextroamphetamine (ADDERALL) 20 MG tablet, Take by mouth., Disp: , Rfl:    amphetamine-dextroamphetamine (ADDERALL) 20 MG tablet, Take 1 tablet (20 mg total) by mouth daily., Disp: 30 tablet, Rfl: 0   cariprazine (VRAYLAR) 3 MG capsule, Take 1 capsule (3 mg total) by mouth daily., Disp: 30 capsule, Rfl: 3   celecoxib (CELEBREX) 200 MG capsule, Take  200 mg by mouth 2 (two) times daily as needed., Disp: , Rfl:    cloNIDine (CATAPRES) 0.1 MG tablet, Take 1 tablet (0.1 mg total) by mouth 2 (two) times daily., Disp: 60 tablet, Rfl: 3   divalproex (DEPAKOTE ER) 500 MG 24 hr tablet, Take 1,500 mg by mouth at bedtime., Disp: , Rfl:    gabapentin (NEURONTIN) 300 MG capsule, SMARTSIG:2-3 Capsule(s) By Mouth 3 Times Daily, Disp: , Rfl:    oxyCODONE-acetaminophen (PERCOCET) 10-325 MG tablet, Take 1 tablet by mouth every 6 (six) hours as needed., Disp: , Rfl:    sertraline (ZOLOFT) 100 MG tablet, Take 200 mg by mouth every morning., Disp: , Rfl:    triamcinolone cream (KENALOG) 0.1 %, Apply topically 2 (two) times daily., Disp: , Rfl:  Medication Side Effects: none  Family Medical/ Social History: Changes? No  MENTAL HEALTH EXAM:  There were no vitals taken for this visit.There is no height or weight on file to calculate BMI.  General Appearance: Casual, Neat, and Well Groomed  Eye Contact:  Good  Speech:  Clear and Coherent  Volume:  Increased  Mood:  Anxious  Affect:  Appropriate  Thought Process:  Coherent  Orientation:  Full (Time, Place, and Person)  Thought Content: Logical   Suicidal Thoughts:  No  Homicidal Thoughts:  No  Memory:  WNL  Judgement:  Fair  Insight:  Good  Psychomotor Activity:  Increased  Concentration:  Concentration: Fair  Recall:  Good  Fund of Knowledge: Good  Language: Good  Assets:  Desire for Improvement  ADL's:  Intact  Cognition: WNL  Prognosis:  Good    DIAGNOSES:    ICD-10-CM   1. Bipolar I disorder (HCC)  F31.9     2. Generalized anxiety disorder  F41.1 cariprazine (VRAYLAR) 3 MG capsule    3. Major depressive disorder, recurrent episode, moderate (HCC)  F33.1 cariprazine (VRAYLAR) 3 MG capsule    4. Bipolar depression (HCC)  F31.9 cariprazine (VRAYLAR) 3 MG capsule    5. Attention deficit hyperactivity disorder (ADHD), combined type  F90.2       Receiving Psychotherapy: No     RECOMMENDATIONS:   Treatment Plan/Recommendations:  Continue Vraylar 3 mg daily. Refills provided. Stop Seroquel 25 mg at bedtime To continue Adderall 30 mg XR  daily Continue Adderall 20 mg IR daily in afternoon Stop Clonidine  Stopped Klonopin To reduce Depakote by 250 mg for two weeks and then stay at 1000 mg at bedtime until next f/u  Continue Gabapentin 300 mg three times daily Greater than 50% of  60 min face to face time with patient was spent on counseling and coordination of care. We discussed his recent improvement in moods as well as irritability and agitation. He Reinforced again and discussed his long term use of benzodiazapine's. Told him we would continue to plan for weaning off the last remaining benzo Ativan in months to come if patient continues improvement. Reinforced potential benefits, risk, and side effects of benzodiazepines to include potential risk of tolerance and dependence, as well as possible drowsiness.  Advised patient not to drive if experiencing drowsiness and to take lowest possible effective dose to minimize risk of dependence and tolerance.  Discussed potential benefits, risks, and side effects of stimulants with patient to include increased heart rate, palpitations, insomnia, increased anxiety, increased irritability, or decreased appetite.  Instructed patient to contact office if experiencing any significant tolerability issues.  Discussed potential metabolic side effects associated with atypical antipsychotics, as well as potential risk for movement side effects. Advised pt to contact office if movement side effects occur.

## 2020-10-05 ENCOUNTER — Other Ambulatory Visit: Payer: Self-pay | Admitting: Behavioral Health

## 2020-10-05 DIAGNOSIS — F902 Attention-deficit hyperactivity disorder, combined type: Secondary | ICD-10-CM

## 2020-10-06 ENCOUNTER — Telehealth: Payer: Self-pay | Admitting: Behavioral Health

## 2020-10-06 NOTE — Telephone Encounter (Signed)
Pt needs PA for Adderall XR 30 mg generic. Has new insurance eff 08/14/20. System updated BCBS.

## 2020-10-06 NOTE — Telephone Encounter (Signed)
Last filled 7/22 appt on 11/11

## 2020-10-07 DIAGNOSIS — Z79899 Other long term (current) drug therapy: Secondary | ICD-10-CM | POA: Diagnosis not present

## 2020-10-07 DIAGNOSIS — R5382 Chronic fatigue, unspecified: Secondary | ICD-10-CM | POA: Diagnosis not present

## 2020-10-07 DIAGNOSIS — F331 Major depressive disorder, recurrent, moderate: Secondary | ICD-10-CM | POA: Diagnosis not present

## 2020-10-07 NOTE — Telephone Encounter (Signed)
Rx sent 

## 2020-10-11 LAB — TESTOSTERONE, FREE, TOTAL, SHBG
Sex Hormone Binding: 39.2 nmol/L (ref 16.5–55.9)
Testosterone, Free: 8 pg/mL — ABNORMAL LOW (ref 8.7–25.1)
Testosterone: 169 ng/dL — ABNORMAL LOW (ref 264–916)

## 2020-10-11 LAB — VALPROIC ACID LEVEL: Valproic Acid Lvl: 49 ug/mL — ABNORMAL LOW (ref 50–100)

## 2020-10-12 DIAGNOSIS — Z981 Arthrodesis status: Secondary | ICD-10-CM | POA: Diagnosis not present

## 2020-10-12 DIAGNOSIS — M5136 Other intervertebral disc degeneration, lumbar region: Secondary | ICD-10-CM | POA: Diagnosis not present

## 2020-10-12 DIAGNOSIS — G894 Chronic pain syndrome: Secondary | ICD-10-CM | POA: Diagnosis not present

## 2020-10-12 DIAGNOSIS — M961 Postlaminectomy syndrome, not elsewhere classified: Secondary | ICD-10-CM | POA: Diagnosis not present

## 2020-10-12 DIAGNOSIS — Z79891 Long term (current) use of opiate analgesic: Secondary | ICD-10-CM | POA: Diagnosis not present

## 2020-10-12 DIAGNOSIS — Z79899 Other long term (current) drug therapy: Secondary | ICD-10-CM | POA: Diagnosis not present

## 2020-10-13 DIAGNOSIS — Z131 Encounter for screening for diabetes mellitus: Secondary | ICD-10-CM | POA: Diagnosis not present

## 2020-10-13 DIAGNOSIS — Z0000 Encounter for general adult medical examination without abnormal findings: Secondary | ICD-10-CM | POA: Diagnosis not present

## 2020-10-13 DIAGNOSIS — Z6836 Body mass index (BMI) 36.0-36.9, adult: Secondary | ICD-10-CM | POA: Diagnosis not present

## 2020-10-20 ENCOUNTER — Telehealth: Payer: Self-pay | Admitting: Behavioral Health

## 2020-10-20 ENCOUNTER — Other Ambulatory Visit: Payer: Self-pay | Admitting: Behavioral Health

## 2020-10-20 DIAGNOSIS — F411 Generalized anxiety disorder: Secondary | ICD-10-CM

## 2020-10-20 DIAGNOSIS — F319 Bipolar disorder, unspecified: Secondary | ICD-10-CM

## 2020-10-20 DIAGNOSIS — F902 Attention-deficit hyperactivity disorder, combined type: Secondary | ICD-10-CM

## 2020-10-20 DIAGNOSIS — Z79899 Other long term (current) drug therapy: Secondary | ICD-10-CM

## 2020-10-20 DIAGNOSIS — F331 Major depressive disorder, recurrent, moderate: Secondary | ICD-10-CM

## 2020-10-20 MED ORDER — LORAZEPAM 1 MG PO TABS: 1.0000 mg | ORAL_TABLET | Freq: Three times a day (TID) | ORAL | 2 refills | Status: DC | PRN

## 2020-10-20 NOTE — Telephone Encounter (Signed)
Please review

## 2020-10-20 NOTE — Telephone Encounter (Signed)
Script sent for Ativan 1 mg, 3 times daily as needed. Please inform her that we are backing amount off as previously discussed  just by 1 tablet per day.

## 2020-10-20 NOTE — Telephone Encounter (Signed)
Pt informed

## 2020-10-20 NOTE — Telephone Encounter (Signed)
Patient's wife Catering manager) called in stating that Ark just finished the last of his Lorazepam 1mg  prescribed from another provider. She say that once he was finished BW would continue to prescribe medication. Ph: [phone] . Pharmacy [facility] [address] [place], [place].

## 2020-10-27 DIAGNOSIS — M545 Low back pain, unspecified: Secondary | ICD-10-CM | POA: Diagnosis not present

## 2020-10-27 DIAGNOSIS — Z79899 Other long term (current) drug therapy: Secondary | ICD-10-CM | POA: Diagnosis not present

## 2020-10-27 DIAGNOSIS — F1290 Cannabis use, unspecified, uncomplicated: Secondary | ICD-10-CM | POA: Diagnosis not present

## 2020-10-27 DIAGNOSIS — G8929 Other chronic pain: Secondary | ICD-10-CM | POA: Diagnosis not present

## 2020-10-27 DIAGNOSIS — E669 Obesity, unspecified: Secondary | ICD-10-CM | POA: Diagnosis not present

## 2020-11-03 ENCOUNTER — Other Ambulatory Visit: Payer: Self-pay | Admitting: Behavioral Health

## 2020-11-03 DIAGNOSIS — F331 Major depressive disorder, recurrent, moderate: Secondary | ICD-10-CM

## 2020-11-03 DIAGNOSIS — F902 Attention-deficit hyperactivity disorder, combined type: Secondary | ICD-10-CM

## 2020-11-03 DIAGNOSIS — F411 Generalized anxiety disorder: Secondary | ICD-10-CM

## 2020-11-03 DIAGNOSIS — F319 Bipolar disorder, unspecified: Secondary | ICD-10-CM

## 2020-11-04 NOTE — Telephone Encounter (Signed)
Last filled 8/24

## 2020-11-12 DIAGNOSIS — E669 Obesity, unspecified: Secondary | ICD-10-CM | POA: Diagnosis not present

## 2020-11-12 DIAGNOSIS — G8929 Other chronic pain: Secondary | ICD-10-CM | POA: Diagnosis not present

## 2020-11-12 DIAGNOSIS — M545 Low back pain, unspecified: Secondary | ICD-10-CM | POA: Diagnosis not present

## 2020-11-12 DIAGNOSIS — Z79899 Other long term (current) drug therapy: Secondary | ICD-10-CM | POA: Diagnosis not present

## 2020-11-12 DIAGNOSIS — M62830 Muscle spasm of back: Secondary | ICD-10-CM | POA: Diagnosis not present

## 2020-11-13 DIAGNOSIS — E291 Testicular hypofunction: Secondary | ICD-10-CM | POA: Diagnosis not present

## 2020-11-20 ENCOUNTER — Other Ambulatory Visit: Payer: Self-pay | Admitting: Behavioral Health

## 2020-11-20 DIAGNOSIS — F902 Attention-deficit hyperactivity disorder, combined type: Secondary | ICD-10-CM

## 2020-11-20 DIAGNOSIS — F411 Generalized anxiety disorder: Secondary | ICD-10-CM

## 2020-11-20 NOTE — Telephone Encounter (Signed)
Please review

## 2020-11-20 NOTE — Telephone Encounter (Signed)
Pt's wife Hospital doctor called.  Oscar Weber advised he would start providing his meds once he had finished with his last provider, which he has now.  She would like the refill of Adderall 20mg  to [facility].  Also requested refill of Depakote to the same pharmacy.  [facility] - [place], [place] - [address]  [address], [place]  Phone:  [phone]  Fax:  [phone]   Next appt 11/11

## 2020-11-27 ENCOUNTER — Other Ambulatory Visit: Payer: Self-pay

## 2020-11-27 MED ORDER — AMPHETAMINE-DEXTROAMPHETAMINE 20 MG PO TABS: ORAL_TABLET | ORAL | 0 refills | Status: DC

## 2020-11-27 MED ORDER — DIVALPROEX SODIUM ER 500 MG PO TB24: 1500.0000 mg | ORAL_TABLET | Freq: Every day | ORAL | 0 refills | Status: DC

## 2020-11-30 DIAGNOSIS — G8929 Other chronic pain: Secondary | ICD-10-CM | POA: Diagnosis not present

## 2020-11-30 DIAGNOSIS — Z79899 Other long term (current) drug therapy: Secondary | ICD-10-CM | POA: Diagnosis not present

## 2020-11-30 DIAGNOSIS — E669 Obesity, unspecified: Secondary | ICD-10-CM | POA: Diagnosis not present

## 2020-11-30 DIAGNOSIS — R030 Elevated blood-pressure reading, without diagnosis of hypertension: Secondary | ICD-10-CM | POA: Diagnosis not present

## 2020-11-30 DIAGNOSIS — Z6838 Body mass index (BMI) 38.0-38.9, adult: Secondary | ICD-10-CM | POA: Diagnosis not present

## 2020-11-30 DIAGNOSIS — M545 Low back pain, unspecified: Secondary | ICD-10-CM | POA: Diagnosis not present

## 2020-11-30 DIAGNOSIS — E291 Testicular hypofunction: Secondary | ICD-10-CM | POA: Diagnosis not present

## 2020-12-02 DIAGNOSIS — Z79899 Other long term (current) drug therapy: Secondary | ICD-10-CM | POA: Diagnosis not present

## 2020-12-02 NOTE — Telephone Encounter (Signed)
All noted and reviewed. Thank you.

## 2020-12-10 ENCOUNTER — Other Ambulatory Visit: Payer: Self-pay | Admitting: Behavioral Health

## 2020-12-10 DIAGNOSIS — F902 Attention-deficit hyperactivity disorder, combined type: Secondary | ICD-10-CM

## 2020-12-11 DIAGNOSIS — R5383 Other fatigue: Secondary | ICD-10-CM | POA: Diagnosis not present

## 2020-12-11 DIAGNOSIS — Z79890 Hormone replacement therapy (postmenopausal): Secondary | ICD-10-CM | POA: Diagnosis not present

## 2020-12-11 DIAGNOSIS — E291 Testicular hypofunction: Secondary | ICD-10-CM | POA: Diagnosis not present

## 2020-12-11 NOTE — Telephone Encounter (Signed)
Pt's wife called.  She says Pharmacy has been sending request to [place] for the Adderall XR 30mg . He has been out for about a week.    Next app 11/11

## 2020-12-16 DIAGNOSIS — E291 Testicular hypofunction: Secondary | ICD-10-CM | POA: Diagnosis not present

## 2020-12-16 DIAGNOSIS — R6882 Decreased libido: Secondary | ICD-10-CM | POA: Diagnosis not present

## 2020-12-16 DIAGNOSIS — Z6835 Body mass index (BMI) 35.0-35.9, adult: Secondary | ICD-10-CM | POA: Diagnosis not present

## 2020-12-16 DIAGNOSIS — R5383 Other fatigue: Secondary | ICD-10-CM | POA: Diagnosis not present

## 2020-12-19 ENCOUNTER — Other Ambulatory Visit: Payer: Self-pay | Admitting: Behavioral Health

## 2020-12-19 DIAGNOSIS — F319 Bipolar disorder, unspecified: Secondary | ICD-10-CM

## 2020-12-19 DIAGNOSIS — F331 Major depressive disorder, recurrent, moderate: Secondary | ICD-10-CM

## 2020-12-19 DIAGNOSIS — F411 Generalized anxiety disorder: Secondary | ICD-10-CM

## 2020-12-21 DIAGNOSIS — R5383 Other fatigue: Secondary | ICD-10-CM | POA: Diagnosis not present

## 2020-12-21 DIAGNOSIS — F900 Attention-deficit hyperactivity disorder, predominantly inattentive type: Secondary | ICD-10-CM | POA: Diagnosis not present

## 2020-12-25 ENCOUNTER — Ambulatory Visit: Payer: BC Managed Care – PPO | Admitting: Behavioral Health

## 2020-12-28 ENCOUNTER — Ambulatory Visit: Payer: BC Managed Care – PPO | Admitting: Behavioral Health

## 2021-01-03 DIAGNOSIS — Z79899 Other long term (current) drug therapy: Secondary | ICD-10-CM | POA: Diagnosis not present

## 2021-01-03 DIAGNOSIS — M545 Low back pain, unspecified: Secondary | ICD-10-CM | POA: Diagnosis not present

## 2021-01-03 DIAGNOSIS — E291 Testicular hypofunction: Secondary | ICD-10-CM | POA: Diagnosis not present

## 2021-01-03 DIAGNOSIS — G8929 Other chronic pain: Secondary | ICD-10-CM | POA: Diagnosis not present

## 2021-01-03 DIAGNOSIS — Z6838 Body mass index (BMI) 38.0-38.9, adult: Secondary | ICD-10-CM | POA: Diagnosis not present

## 2021-01-03 DIAGNOSIS — E669 Obesity, unspecified: Secondary | ICD-10-CM | POA: Diagnosis not present

## 2021-01-03 DIAGNOSIS — R030 Elevated blood-pressure reading, without diagnosis of hypertension: Secondary | ICD-10-CM | POA: Diagnosis not present

## 2021-01-04 ENCOUNTER — Other Ambulatory Visit: Payer: Self-pay | Admitting: Behavioral Health

## 2021-01-04 ENCOUNTER — Telehealth: Payer: Self-pay | Admitting: Behavioral Health

## 2021-01-04 DIAGNOSIS — F411 Generalized anxiety disorder: Secondary | ICD-10-CM

## 2021-01-04 DIAGNOSIS — F902 Attention-deficit hyperactivity disorder, combined type: Secondary | ICD-10-CM

## 2021-01-04 NOTE — Telephone Encounter (Signed)
Pt requesting refill for Adderall 20 mg @ [facility]. Amber pt's wife has called 2 times last week about this refill. The 30 XR was sent, but not 20 mg. apt 12/6

## 2021-01-04 NOTE — Telephone Encounter (Signed)
Pended.

## 2021-01-05 ENCOUNTER — Other Ambulatory Visit: Payer: Self-pay | Admitting: Behavioral Health

## 2021-01-05 DIAGNOSIS — F902 Attention-deficit hyperactivity disorder, combined type: Secondary | ICD-10-CM

## 2021-01-05 MED ORDER — AMPHETAMINE-DEXTROAMPHETAMINE 20 MG PO TABS: 20.0000 mg | ORAL_TABLET | Freq: Every day | ORAL | 0 refills | Status: DC

## 2021-01-05 MED ORDER — AMPHETAMINE-DEXTROAMPHET ER 30 MG PO CP24: ORAL_CAPSULE | ORAL | 0 refills | Status: DC

## 2021-01-05 NOTE — Telephone Encounter (Signed)
Please send,I don't see in the note that he is not suppose to be taking it and his wife has called again

## 2021-01-05 NOTE — Telephone Encounter (Signed)
Please check notes or provider should pt have 30 XR Adderall and 20 mg Adderall? Seems it's not being sent in for a reason. thanks

## 2021-01-19 ENCOUNTER — Telehealth: Payer: BC Managed Care – PPO | Admitting: Behavioral Health

## 2021-01-20 ENCOUNTER — Other Ambulatory Visit: Payer: Self-pay | Admitting: Behavioral Health

## 2021-01-20 DIAGNOSIS — F319 Bipolar disorder, unspecified: Secondary | ICD-10-CM

## 2021-01-20 DIAGNOSIS — F331 Major depressive disorder, recurrent, moderate: Secondary | ICD-10-CM

## 2021-01-20 DIAGNOSIS — F411 Generalized anxiety disorder: Secondary | ICD-10-CM

## 2021-01-20 NOTE — Telephone Encounter (Signed)
Pt's wife called in (she is on the [facility]).  The Adderall XR is available at [facility], [address].  Also you can send the Lorazepam there this time also.  Next appt. 12/19

## 2021-01-21 ENCOUNTER — Other Ambulatory Visit: Payer: Self-pay

## 2021-01-21 DIAGNOSIS — F411 Generalized anxiety disorder: Secondary | ICD-10-CM

## 2021-01-21 DIAGNOSIS — F331 Major depressive disorder, recurrent, moderate: Secondary | ICD-10-CM

## 2021-01-21 DIAGNOSIS — F319 Bipolar disorder, unspecified: Secondary | ICD-10-CM

## 2021-01-21 MED ORDER — AMPHETAMINE-DEXTROAMPHET ER 30 MG PO CP24: 30.0000 mg | ORAL_CAPSULE | Freq: Every morning | ORAL | 0 refills | Status: DC

## 2021-01-21 MED ORDER — LORAZEPAM 1 MG PO TABS: 1.0000 mg | ORAL_TABLET | Freq: Three times a day (TID) | ORAL | 2 refills | Status: DC | PRN

## 2021-02-01 ENCOUNTER — Other Ambulatory Visit: Payer: Self-pay

## 2021-02-01 ENCOUNTER — Encounter: Payer: Self-pay | Admitting: Behavioral Health

## 2021-02-01 ENCOUNTER — Other Ambulatory Visit: Payer: Self-pay | Admitting: Behavioral Health

## 2021-02-01 ENCOUNTER — Ambulatory Visit: Payer: BC Managed Care – PPO | Admitting: Behavioral Health

## 2021-02-01 DIAGNOSIS — Z79899 Other long term (current) drug therapy: Secondary | ICD-10-CM

## 2021-02-01 DIAGNOSIS — F319 Bipolar disorder, unspecified: Secondary | ICD-10-CM

## 2021-02-01 DIAGNOSIS — F331 Major depressive disorder, recurrent, moderate: Secondary | ICD-10-CM

## 2021-02-01 DIAGNOSIS — F902 Attention-deficit hyperactivity disorder, combined type: Secondary | ICD-10-CM

## 2021-02-01 DIAGNOSIS — F99 Mental disorder, not otherwise specified: Secondary | ICD-10-CM

## 2021-02-01 DIAGNOSIS — F5105 Insomnia due to other mental disorder: Secondary | ICD-10-CM

## 2021-02-01 DIAGNOSIS — F411 Generalized anxiety disorder: Secondary | ICD-10-CM

## 2021-02-01 DIAGNOSIS — R5382 Chronic fatigue, unspecified: Secondary | ICD-10-CM

## 2021-02-01 MED ORDER — AMPHETAMINE-DEXTROAMPHET ER 30 MG PO CP24: ORAL_CAPSULE | ORAL | 0 refills | Status: DC

## 2021-02-01 MED ORDER — VRAYLAR 4.5 MG PO CAPS: 4.5000 mg | ORAL_CAPSULE | Freq: Every day | ORAL | 1 refills | Status: DC

## 2021-02-01 MED ORDER — DIVALPROEX SODIUM ER 500 MG PO TB24
1500.0000 mg | ORAL_TABLET | Freq: Every day | ORAL | 0 refills | Status: DC
Start: 2021-02-01 — End: 2021-11-26

## 2021-02-01 MED ORDER — AMPHETAMINE-DEXTROAMPHETAMINE 20 MG PO TABS: 20.0000 mg | ORAL_TABLET | Freq: Every day | ORAL | 0 refills | Status: DC

## 2021-02-01 MED ORDER — SERTRALINE HCL 100 MG PO TABS: 200.0000 mg | ORAL_TABLET | Freq: Every morning | ORAL | 1 refills | Status: DC

## 2021-02-01 MED ORDER — QUETIAPINE FUMARATE 50 MG PO TABS: 50.0000 mg | ORAL_TABLET | Freq: Every day | ORAL | 1 refills | Status: DC

## 2021-02-01 NOTE — Progress Notes (Signed)
[facility]  Patient ID: OLIVE MOTYKA,  MRN: [id number]  PCP: Charlane Ferretti, DO  Date of Evaluation: 02/01/2021 Time spent:30 minutes  Chief Complaint:  Chief Complaint   Anxiety; Depression; Manic Behavior; Medication Refill; Medication Problem; Follow-up     HISTORY/CURRENT STATUS: HPI 40 year old male presents to this office for follow up and medication management. He says that he was feeling much better with the Vraylar 3 mg until he become very dissatisfied with work. Says that even though he has some irritability and agitation, he feels much more balanced and controlled than before He says that his wife also sees the  improvement with moods but she believes he could benefit from increase in Remlap. She is seeing some waring signs of mood fluctuations. He says that he feels his thinking is more clear and he does not feel as sluggish. Says he would like to continue to wean off Depakote and Ativan as progress permits.  He reports his anxiety today is 6/10 and depression 4/10. He is sleeping 7-8 hours per night with aid of Seroquel. No mania present, no psychosis. No SI/HI.   Past psychiatric medication trials: Zoloft Prozac Celexa Lexapro Paxil Wellbutrin Remeron Seroquel Geodon Depakote        Individual Medical History/ Review of Systems: Changes? :No   Allergies: Patient has no known allergies.  Current Medications:  Current Outpatient Medications:    Cariprazine HCl (VRAYLAR) 4.5 MG CAPS, Take 1 capsule (4.5 mg total) by mouth daily., Disp: 30 capsule, Rfl: 1   QUEtiapine (SEROQUEL) 50 MG tablet, Take 1 tablet (50 mg total) by mouth at bedtime., Disp: 90 tablet, Rfl: 1   albuterol (VENTOLIN HFA) 108 (90 Base) MCG/ACT inhaler, Inhale into the lungs., Disp: , Rfl:    amphetamine-dextroamphetamine (ADDERALL XR) 30 MG 24 hr capsule, Take 1 capsule (30 mg total) by mouth every morning., Disp: 30 capsule, Rfl: 0   [START ON 02/20/2021]  amphetamine-dextroamphetamine (ADDERALL XR) 30 MG 24 hr capsule, TAKE 1 CAPSULE BY MOUTH EVERY DAY, Disp: 30 capsule, Rfl: 0   amphetamine-dextroamphetamine (ADDERALL) 20 MG tablet, Take 1 tablet (20 mg) by mouth daily in the afternoon, Disp: 30 tablet, Rfl: 0   [START ON 02/03/2021] amphetamine-dextroamphetamine (ADDERALL) 20 MG tablet, Take 1 tablet (20 mg total) by mouth daily., Disp: 30 tablet, Rfl: 0   celecoxib (CELEBREX) 200 MG capsule, Take 200 mg by mouth 2 (two) times daily as needed., Disp: , Rfl:    cloNIDine (CATAPRES) 0.1 MG tablet, TAKE 1 TABLET BY MOUTH 2 TIMES DAILY, Disp: 60 tablet, Rfl: 0   divalproex (DEPAKOTE ER) 500 MG 24 hr tablet, Take 3 tablets (1,500 mg total) by mouth at bedtime., Disp: 270 tablet, Rfl: 0   gabapentin (NEURONTIN) 300 MG capsule, SMARTSIG:2-3 Capsule(s) By Mouth 3 Times Daily, Disp: , Rfl:    LORazepam (ATIVAN) 1 MG tablet, Take 1 tablet (1 mg total) by mouth 3 (three) times daily as needed for anxiety., Disp: 90 tablet, Rfl: 2   oxyCODONE-acetaminophen (PERCOCET) 10-325 MG tablet, Take 1 tablet by mouth every 6 (six) hours as needed., Disp: , Rfl:    QUEtiapine (SEROQUEL) 25 MG tablet, Take by mouth., Disp: , Rfl:    sertraline (ZOLOFT) 100 MG tablet, Take 2 tablets (200 mg total) by mouth every morning., Disp: 90 tablet, Rfl: 1   triamcinolone cream (KENALOG) 0.1 %, Apply topically 2 (two) times daily., Disp: , Rfl:    VRAYLAR 3 MG capsule, TAKE 1 CAPSULE BY MOUTH  ONCE DAILY, Disp: 30 capsule, Rfl: 1 Medication Side Effects: none  Family Medical/ Social History: Changes? No  MENTAL HEALTH EXAM:  There were no vitals taken for this visit.There is no height or weight on file to calculate BMI.  General Appearance: Casual and Neat  Eye Contact:  Good  Speech:  Clear and Coherent  Volume:  Normal  Mood:  Anxious and Depressed  Affect:  Appropriate, Congruent, Depressed, Labile, and Anxious  Thought Process:  Coherent  Orientation:  Full (Time, Place,  and Person)  Thought Content: Logical   Suicidal Thoughts:  No  Homicidal Thoughts:  No  Memory:  WNL  Judgement:  Good  Insight:  Good  Psychomotor Activity:  Normal  Concentration:  Concentration: Good  Recall:  Good  Fund of Knowledge: Good  Language: Good  Assets:  Desire for Improvement  ADL's:  Intact  Cognition: WNL  Prognosis:  Good    DIAGNOSES:    ICD-10-CM   1. Attention deficit hyperactivity disorder (ADHD), combined type  F90.2 amphetamine-dextroamphetamine (ADDERALL XR) 30 MG 24 hr capsule    amphetamine-dextroamphetamine (ADDERALL) 20 MG tablet    2. Generalized anxiety disorder  F41.1 sertraline (ZOLOFT) 100 MG tablet    Cariprazine HCl (VRAYLAR) 4.5 MG CAPS    QUEtiapine (SEROQUEL) 50 MG tablet    3. Bipolar depression ([facility])  F31.9 sertraline (ZOLOFT) 100 MG tablet    Cariprazine HCl (VRAYLAR) 4.5 MG CAPS    divalproex (DEPAKOTE ER) 500 MG 24 hr tablet    4. High risk medication use  Z79.899     5. Chronic fatigue  R53.82     6. Insomnia due to other mental disorder  F51.05 QUEtiapine (SEROQUEL) 50 MG tablet   F99       Receiving Psychotherapy: Yes    RECOMMENDATIONS:   Increase Vraylar  to 4.5 mg daily. Refills provided. Continue Seroquel 50  mg at bedtime To continue Adderall 30 mg XR  daily Continue Adderall 20 mg IR daily in afternoon Stopped Klonopin Continue Depakote 1000 mg at bedtime Continue Gabapentin 300 mg three times daily Greater than 50% of  30  min face to face time with patient was spent on counseling and coordination of care. We discussed his recent improvement in moods recently until he started becoming less satisfied with work. He does not understand that this could to be related to his cycling with moods. He Reinforced again and discussed his long term use of benzodiazapine's. He is currently experiencing increased irritability due to unhappiness with work. Increased anxiety and depression. Reinforced potential benefits, risk,  and side effects of benzodiazepines to include potential risk of tolerance and dependence, as well as possible drowsiness.  Advised patient not to drive if experiencing drowsiness and to take lowest possible effective dose to minimize risk of dependence and tolerance.  Discussed potential benefits, risks, and side effects of stimulants with patient to include increased heart rate, palpitations, insomnia, increased anxiety, increased irritability, or decreased appetite.  Instructed patient to contact office if experiencing any significant tolerability issues.  Discussed potential metabolic side effects associated with atypical antipsychotics, as well as potential risk for movement side effects. Advised pt to contact office if movement side effects occur.     Elwanda Brooklyn, NP

## 2021-02-02 DIAGNOSIS — Z6838 Body mass index (BMI) 38.0-38.9, adult: Secondary | ICD-10-CM | POA: Diagnosis not present

## 2021-02-02 DIAGNOSIS — M545 Low back pain, unspecified: Secondary | ICD-10-CM | POA: Diagnosis not present

## 2021-02-02 DIAGNOSIS — Z6837 Body mass index (BMI) 37.0-37.9, adult: Secondary | ICD-10-CM | POA: Diagnosis not present

## 2021-02-02 DIAGNOSIS — Z79899 Other long term (current) drug therapy: Secondary | ICD-10-CM | POA: Diagnosis not present

## 2021-02-02 DIAGNOSIS — G8929 Other chronic pain: Secondary | ICD-10-CM | POA: Diagnosis not present

## 2021-02-02 DIAGNOSIS — R030 Elevated blood-pressure reading, without diagnosis of hypertension: Secondary | ICD-10-CM | POA: Diagnosis not present

## 2021-02-14 ENCOUNTER — Emergency Department (HOSPITAL_COMMUNITY): Payer: BC Managed Care – PPO

## 2021-02-14 ENCOUNTER — Other Ambulatory Visit: Payer: Self-pay

## 2021-02-14 ENCOUNTER — Emergency Department (HOSPITAL_COMMUNITY)
Admission: EM | Admit: 2021-02-14 | Discharge: 2021-02-14 | Disposition: A | Discharge status: Home / Self Care | Payer: BC Managed Care – PPO | Attending: Emergency Medicine | Admitting: Emergency Medicine

## 2021-02-14 ENCOUNTER — Encounter (HOSPITAL_COMMUNITY): Payer: Self-pay

## 2021-02-14 DIAGNOSIS — J40 Bronchitis, not specified as acute or chronic: Secondary | ICD-10-CM | POA: Diagnosis not present

## 2021-02-14 DIAGNOSIS — U071 COVID-19: Secondary | ICD-10-CM | POA: Diagnosis not present

## 2021-02-14 DIAGNOSIS — R0902 Hypoxemia: Secondary | ICD-10-CM | POA: Diagnosis not present

## 2021-02-14 DIAGNOSIS — T50905A Adverse effect of unspecified drugs, medicaments and biological substances, initial encounter: Secondary | ICD-10-CM | POA: Insufficient documentation

## 2021-02-14 DIAGNOSIS — Z20822 Contact with and (suspected) exposure to covid-19: Secondary | ICD-10-CM | POA: Insufficient documentation

## 2021-02-14 DIAGNOSIS — R531 Weakness: Secondary | ICD-10-CM | POA: Diagnosis not present

## 2021-02-14 DIAGNOSIS — J209 Acute bronchitis, unspecified: Secondary | ICD-10-CM | POA: Diagnosis not present

## 2021-02-14 DIAGNOSIS — R059 Cough, unspecified: Secondary | ICD-10-CM | POA: Diagnosis not present

## 2021-02-14 DIAGNOSIS — T887XXA Unspecified adverse effect of drug or medicament, initial encounter: Secondary | ICD-10-CM | POA: Diagnosis not present

## 2021-02-14 DIAGNOSIS — R000 Tachycardia, unspecified: Secondary | ICD-10-CM | POA: Insufficient documentation

## 2021-02-14 DIAGNOSIS — W19XXXA Unspecified fall, initial encounter: Secondary | ICD-10-CM | POA: Diagnosis not present

## 2021-02-14 HISTORY — DX: Other chronic pain: G89.29

## 2021-02-14 HISTORY — DX: Depression, unspecified: F32.A

## 2021-02-14 HISTORY — DX: Anxiety disorder, unspecified: F41.9

## 2021-02-14 HISTORY — DX: Essential (primary) hypertension: I10

## 2021-02-14 LAB — RESP PANEL BY RT-PCR (FLU A&B, COVID) ARPGX2
Influenza A by PCR: NEGATIVE
Influenza B by PCR: NEGATIVE
SARS Coronavirus 2 by RT PCR: POSITIVE — AB

## 2021-02-14 MED ORDER — ALBUTEROL SULFATE (2.5 MG/3ML) 0.083% IN NEBU
5.0000 mg | INHALATION_SOLUTION | Freq: Once | RESPIRATORY_TRACT | Status: AC
  Administered 2021-02-14: 5 mg via RESPIRATORY_TRACT
  Filled 2021-02-14: qty 6

## 2021-02-14 MED ORDER — ALBUTEROL SULFATE HFA 108 (90 BASE) MCG/ACT IN AERS: 1.0000 | INHALATION_SPRAY | RESPIRATORY_TRACT | 0 refills | Status: AC | PRN

## 2021-02-14 MED ORDER — LACTATED RINGERS IV BOLUS
1000.0000 mL | Freq: Once | INTRAVENOUS | Status: AC
  Administered 2021-02-14: 1000 mL via INTRAVENOUS

## 2021-02-14 MED ORDER — LORAZEPAM 1 MG PO TABS
1.0000 mg | ORAL_TABLET | Freq: Once | ORAL | Status: AC
  Administered 2021-02-14: 1 mg via ORAL
  Filled 2021-02-14: qty 1

## 2021-02-14 NOTE — ED Triage Notes (Signed)
Patient found by mother with pinpoint pupils, and lethargic after smoking weed. Pt denies any other drug use. No respiratory distress. Ambulatory to stretcher.

## 2021-02-14 NOTE — ED Provider Notes (Signed)
[facility] DEPT Provider Note   CSN: [id number] Arrival date & time: 02/14/21  1633     History  Chief Complaint  Patient presents with   Drug Overdose    Oscar Weber is a 41 y.o. male.  HPI 41 year old male with a prior history of chronic pain, depression and anxiety presents with potential drug overdose.  History is mostly from patient initially.  He said he smoked some weed that he got from his normal dealer this afternoon, approximately 4 PM, and his mom found him with small pupils and was concerned about an overdose.  EMS was called.  He had some borderline low O2 sats in the high 80s/low 90s was put on some oxygen.  Otherwise he did not need to receive any medicines.  Patient states he was not trying to hurt himself or harm himself.  He did not take anything else besides his chronic meds. He denies any LOC today.  He does endorse that he is been having some congestion and cough and respiratory symptoms for about a week or 10 days.  Some cough with loose sputum and congestion.  Not much shortness of breath.  He denies any smoking history.  He has been feeling generally weak since this started.  Home Medications Prior to Admission medications   Medication Sig Start Date End Date Taking? Authorizing Provider  albuterol (VENTOLIN HFA) 108 (90 Base) MCG/ACT inhaler Inhale 1-2 puffs into the lungs every 4 (four) hours as needed for wheezing or shortness of breath. 02/14/21  Yes Sherwood Gambler, MD  amphetamine-dextroamphetamine (ADDERALL XR) 30 MG 24 hr capsule Take 1 capsule (30 mg total) by mouth every morning. 01/21/21   Elwanda Brooklyn, NP  amphetamine-dextroamphetamine (ADDERALL XR) 30 MG 24 hr capsule TAKE 1 CAPSULE BY MOUTH EVERY DAY 02/20/21 03/22/21  Elwanda Brooklyn, NP  amphetamine-dextroamphetamine (ADDERALL) 20 MG tablet Take 1 tablet (20 mg) by mouth daily in the afternoon 11/27/20   Cottle, Billey Co., MD  amphetamine-dextroamphetamine (ADDERALL)  20 MG tablet Take 1 tablet (20 mg total) by mouth daily. 02/03/21 03/05/21  Elwanda Brooklyn, NP  Cariprazine HCl (VRAYLAR) 4.5 MG CAPS Take 1 capsule (4.5 mg total) by mouth daily. 02/01/21   Elwanda Brooklyn, NP  celecoxib (CELEBREX) 200 MG capsule Take 200 mg by mouth 2 (two) times daily as needed. 07/27/20   [provider]  cloNIDine (CATAPRES) 0.1 MG tablet TAKE 1 TABLET BY MOUTH 2 TIMES DAILY 01/04/21   Lesle Chris A, NP  divalproex (DEPAKOTE ER) 500 MG 24 hr tablet Take 3 tablets (1,500 mg total) by mouth at bedtime. 02/01/21   Elwanda Brooklyn, NP  gabapentin (NEURONTIN) 300 MG capsule SMARTSIG:2-3 Capsule(s) By Mouth 3 Times Daily 07/28/20   [provider]  LORazepam (ATIVAN) 1 MG tablet Take 1 tablet (1 mg total) by mouth 3 (three) times daily as needed for anxiety. 01/21/21   Elwanda Brooklyn, NP  oxyCODONE-acetaminophen (PERCOCET) 10-325 MG tablet Take 1 tablet by mouth every 6 (six) hours as needed. 08/19/20   [provider]  QUEtiapine (SEROQUEL) 25 MG tablet Take by mouth. 01/04/21   [provider]  QUEtiapine (SEROQUEL) 50 MG tablet Take 1 tablet (50 mg total) by mouth at bedtime. 02/01/21   Elwanda Brooklyn, NP  sertraline (ZOLOFT) 100 MG tablet Take 2 tablets (200 mg total) by mouth every morning. 02/01/21   Elwanda Brooklyn, NP  triamcinolone cream (KENALOG) 0.1 % Apply topically 2 (  two) times daily. 07/24/20   [provider]  VRAYLAR 3 MG capsule TAKE 1 CAPSULE BY MOUTH ONCE DAILY 11/04/20   Elwanda Brooklyn, NP      Allergies    Bupropion and Gadolinium derivatives    Review of Systems   Review of Systems  Constitutional:  Negative for fever.  HENT:  Positive for congestion.   Respiratory:  Positive for cough. Negative for shortness of breath.   Psychiatric/Behavioral:  Negative for self-injury and suicidal ideas.    Physical Exam Updated Vital Signs BP (!) 156/99    Pulse (!) 104    Temp 97.8 F (36.6 C) (Oral)    Resp 19    Ht 6\' 1"   (1.854 m)    Wt 115.7 kg    SpO2 92%    BMI 33.64 kg/m  Physical Exam Vitals and nursing note reviewed.  Constitutional:      Appearance: He is well-developed. He is not ill-appearing or diaphoretic.  HENT:     Head: Normocephalic and atraumatic.     Nose: Nose normal.  Eyes:     General:        Right eye: No discharge.        Left eye: No discharge.     Pupils: Pupils are equal, round, and reactive to light.  Cardiovascular:     Rate and Rhythm: Regular rhythm. Tachycardia present.     Heart sounds: Normal heart sounds.  Pulmonary:     Effort: Pulmonary effort is normal.     Breath sounds: Wheezing (diffuse) present.  Abdominal:     General: There is no distension.  Skin:    General: Skin is warm and dry.  Neurological:     Mental Status: He is alert.     Comments: 5/5 strength in all 4 extremities.  Clear speech Oriented to person, place, time, situation  Psychiatric:     Comments: Patient is visibly upset and at times tearful    ED Results / Procedures / Treatments   Labs (all labs ordered are listed, but only abnormal results are displayed) Labs Reviewed  RESP PANEL BY RT-PCR (FLU A&B, COVID) [id number]  CBC WITH DIFFERENTIAL/PLATELET  COMPREHENSIVE METABOLIC PANEL    EKG EKG Interpretation  Date/Time:  Sunday February 14 2021 17:18:27 EST Ventricular Rate:  106 PR Interval:  149 QRS Duration: 102 QT Interval:  337 QTC Calculation: 448 R Axis:   79 Text Interpretation: Sinus tachycardia ST elev, probable normal early repol pattern No old tracing to compare Confirmed by Daleen Bo [phone]) on 02/14/2021 5:27:34 PM  Radiology DG Chest 2 View  Result Date: 02/14/2021 CLINICAL DATA:  41 year old male with history of cough for 1 week. EXAM: CHEST - 2 VIEW COMPARISON:  No priors. FINDINGS: Lung volumes are normal. No consolidative airspace disease. No pleural effusions. No pneumothorax. No pulmonary nodule or mass noted. Pulmonary vasculature and the  cardiomediastinal silhouette are within normal limits. IMPRESSION: No radiographic evidence of acute cardiopulmonary disease. Electronically Signed   By: Vinnie Langton M.D.   On: 02/14/2021 17:40    Procedures Procedures    Medications Ordered in ED Medications  lactated ringers bolus 1,000 mL (1,000 mLs Intravenous New Bag/Given 02/14/21 1720)  albuterol (PROVENTIL) (2.5 MG/3ML) 0.083% nebulizer solution 5 mg (5 mg Nebulization Given 02/14/21 1721)  LORazepam (ATIVAN) tablet 1 mg (1 mg Oral Given 02/14/21 1724)    ED Course/ Medical Decision Making/ A&P  Medical Decision Making  I reviewed his most recent behavioral health note. He is on multiple meds for bipolar, chronic pain, anxiety, etc. Denies SI today. Labs were ordered given his complaint of generalized weakness, but then later he refused them. Chest xray reviewed by myself, and I do not see any obvious pneumonia and agree with radiology.  I talked with mom, Millie, she gave him his oxycodone, and he went to smoke weed. Was staring at garage, wobbly, out of it. Seemed confused, sleepy. She put cold water on him, slapped him to keep him awake. Was "out of it" for 30 minutes. Mom says it was a new weed he'd never had before.  Patient endorses that he is smoking weed due to a lot of chronic pain and it seems to be the only thing that helps.  We did discuss that it could have been laced with something.  However at this point, he is back to normal and has been observed in the emergency department for almost 2 hours with no recurrent altered mental status or intoxication.  I highly doubt he had a seizure this afternoon.  I do not think acute CNS imaging is warranted.  From a respiratory perspective it does seem like he probably has some acute bronchitis.  He feels better with the albuterol nebulizer and sometimes uses an albuterol inhaler at home so we will prescribe this for him and send it to his pharmacy.  However  it does not seem like he has a history of asthma so I do not think steroids are indicated.  He is asking to leave and I do not think there is any reason to hold him here against as well so he will be discharged in the care of his wife.       Final Clinical Impression(s) / ED Diagnoses Final diagnoses:  Acute bronchitis, unspecified organism  Adverse effect of drug, initial encounter    Rx / DC Orders ED Discharge Orders          Ordered    albuterol (VENTOLIN HFA) 108 (90 Base) MCG/ACT inhaler  Every 4 hours PRN        02/14/21 1821              Sherwood Gambler, MD 02/14/21 1828

## 2021-02-14 NOTE — ED Notes (Signed)
Patient refused bloodwork 

## 2021-02-26 DIAGNOSIS — R5383 Other fatigue: Secondary | ICD-10-CM | POA: Diagnosis not present

## 2021-02-26 DIAGNOSIS — E291 Testicular hypofunction: Secondary | ICD-10-CM | POA: Diagnosis not present

## 2021-02-26 DIAGNOSIS — Z79890 Hormone replacement therapy (postmenopausal): Secondary | ICD-10-CM | POA: Diagnosis not present

## 2021-03-01 ENCOUNTER — Other Ambulatory Visit: Payer: Self-pay | Admitting: Behavioral Health

## 2021-03-01 DIAGNOSIS — F902 Attention-deficit hyperactivity disorder, combined type: Secondary | ICD-10-CM

## 2021-03-01 DIAGNOSIS — F411 Generalized anxiety disorder: Secondary | ICD-10-CM

## 2021-03-02 DIAGNOSIS — M2550 Pain in unspecified joint: Secondary | ICD-10-CM | POA: Diagnosis not present

## 2021-03-02 DIAGNOSIS — R6882 Decreased libido: Secondary | ICD-10-CM | POA: Diagnosis not present

## 2021-03-02 DIAGNOSIS — Z6834 Body mass index (BMI) 34.0-34.9, adult: Secondary | ICD-10-CM | POA: Diagnosis not present

## 2021-03-02 DIAGNOSIS — E291 Testicular hypofunction: Secondary | ICD-10-CM | POA: Diagnosis not present

## 2021-03-03 NOTE — Telephone Encounter (Signed)
Filled 12/21

## 2021-03-04 DIAGNOSIS — M545 Low back pain, unspecified: Secondary | ICD-10-CM | POA: Diagnosis not present

## 2021-03-04 DIAGNOSIS — F39 Unspecified mood [affective] disorder: Secondary | ICD-10-CM | POA: Diagnosis not present

## 2021-03-04 DIAGNOSIS — G8929 Other chronic pain: Secondary | ICD-10-CM | POA: Diagnosis not present

## 2021-03-04 DIAGNOSIS — R030 Elevated blood-pressure reading, without diagnosis of hypertension: Secondary | ICD-10-CM | POA: Diagnosis not present

## 2021-03-04 DIAGNOSIS — Z6837 Body mass index (BMI) 37.0-37.9, adult: Secondary | ICD-10-CM | POA: Diagnosis not present

## 2021-03-04 DIAGNOSIS — Z79899 Other long term (current) drug therapy: Secondary | ICD-10-CM | POA: Diagnosis not present

## 2021-03-09 ENCOUNTER — Telehealth: Payer: Self-pay | Admitting: Behavioral Health

## 2021-03-09 ENCOUNTER — Other Ambulatory Visit: Payer: Self-pay | Admitting: Behavioral Health

## 2021-03-09 DIAGNOSIS — Z79899 Other long term (current) drug therapy: Secondary | ICD-10-CM

## 2021-03-09 DIAGNOSIS — F411 Generalized anxiety disorder: Secondary | ICD-10-CM

## 2021-03-09 DIAGNOSIS — F902 Attention-deficit hyperactivity disorder, combined type: Secondary | ICD-10-CM

## 2021-03-09 DIAGNOSIS — F319 Bipolar disorder, unspecified: Secondary | ICD-10-CM

## 2021-03-09 DIAGNOSIS — R5382 Chronic fatigue, unspecified: Secondary | ICD-10-CM

## 2021-03-09 MED ORDER — CARIPRAZINE HCL 3 MG PO CAPS: 3.0000 mg | ORAL_CAPSULE | Freq: Every day | ORAL | 3 refills | Status: DC

## 2021-03-09 NOTE — Telephone Encounter (Signed)
Noted,  please advise pt to reduce back down to Vraylar 3 mg capsule. Escribed new script to pharm.

## 2021-03-09 NOTE — Telephone Encounter (Signed)
Pt left a message stating that the vraykar 4 mg is too strong and he can't take it. He would like to go back down to vraylar 3 mg. He needs a refill of the 3 mg sent in to his pharmacy

## 2021-03-09 NOTE — Telephone Encounter (Signed)
See note from patient. Called him and he said on the 4 mg he has malaise, a dull feeling, and he is "just not myself." He felt the 3 mg was helping with some of his bipolar sx.

## 2021-03-09 NOTE — Telephone Encounter (Signed)
Patient notified

## 2021-03-29 ENCOUNTER — Encounter: Payer: Self-pay | Admitting: Behavioral Health

## 2021-03-29 ENCOUNTER — Other Ambulatory Visit: Payer: Self-pay | Admitting: Behavioral Health

## 2021-03-29 ENCOUNTER — Ambulatory Visit (INDEPENDENT_AMBULATORY_CARE_PROVIDER_SITE_OTHER): Payer: BC Managed Care – PPO | Admitting: Behavioral Health

## 2021-03-29 ENCOUNTER — Other Ambulatory Visit: Payer: Self-pay

## 2021-03-29 DIAGNOSIS — F411 Generalized anxiety disorder: Secondary | ICD-10-CM | POA: Diagnosis not present

## 2021-03-29 DIAGNOSIS — R5382 Chronic fatigue, unspecified: Secondary | ICD-10-CM

## 2021-03-29 DIAGNOSIS — F902 Attention-deficit hyperactivity disorder, combined type: Secondary | ICD-10-CM | POA: Diagnosis not present

## 2021-03-29 DIAGNOSIS — Z79899 Other long term (current) drug therapy: Secondary | ICD-10-CM | POA: Diagnosis not present

## 2021-03-29 DIAGNOSIS — F99 Mental disorder, not otherwise specified: Secondary | ICD-10-CM

## 2021-03-29 DIAGNOSIS — F319 Bipolar disorder, unspecified: Secondary | ICD-10-CM

## 2021-03-29 DIAGNOSIS — F5105 Insomnia due to other mental disorder: Secondary | ICD-10-CM

## 2021-03-29 NOTE — Progress Notes (Signed)
[facility]  Patient ID: Oscar Weber,  MRN: [id number]  PCP: Emelia Loron, NP  Date of Evaluation: 03/29/2021 Time spent:30 minutes  Chief Complaint:  Chief Complaint   Anxiety; Depression; Follow-up; Medication Refill     HISTORY/CURRENT STATUS: HPI  41 year old male presents to this office for follow up and medication management. He says that he was feeling much better with the Vraylar 3 mg. Says that when he was taking 4.5 mg it made him completely numb to any emotions. Says that even though he has some irritability and agitation, he feels much more balanced and controlled than before.  He recently quit his job and is feeling less stressed and happy. He is in process of searching for new one. She is seeing some waring signs of mood fluctuations. He says that he feels his thinking is more clear and he does not feel as sluggish. Says he would like to wean off his Depakote and Gabapentin. He reports his anxiety today is 4/10 and depression 3/10. He is sleeping 7-8 hours per night with aid of Seroquel. No mania present, no psychosis. No SI/HI.   Past psychiatric medication trials: Zoloft Prozac Celexa Lexapro Paxil Wellbutrin Remeron Seroquel Geodon Depakote      Individual Medical History/ Review of Systems: Changes? :No   Allergies: Bupropion and Gadolinium derivatives  Current Medications:  Current Outpatient Medications:    albuterol (VENTOLIN HFA) 108 (90 Base) MCG/ACT inhaler, Inhale 1-2 puffs into the lungs every 4 (four) hours as needed for wheezing or shortness of breath., Disp: 6.7 g, Rfl: 0   amphetamine-dextroamphetamine (ADDERALL XR) 30 MG 24 hr capsule, Take 1 capsule (30 mg total) by mouth every morning., Disp: 30 capsule, Rfl: 0   amphetamine-dextroamphetamine (ADDERALL XR) 30 MG 24 hr capsule, TAKE 1 CAPSULE BY MOUTH EVERY DAY, Disp: 30 capsule, Rfl: 0   amphetamine-dextroamphetamine (ADDERALL) 20 MG tablet, Take 1 tablet (20 mg) by mouth  daily in the afternoon, Disp: 30 tablet, Rfl: 0   amphetamine-dextroamphetamine (ADDERALL) 20 MG tablet, TAKE 1 TABLET BY MOUTH EVERY DAY, Disp: 30 tablet, Rfl: 0   cariprazine (VRAYLAR) 3 MG capsule, Take 1 capsule (3 mg total) by mouth daily., Disp: 30 capsule, Rfl: 3   Cariprazine HCl (VRAYLAR) 4.5 MG CAPS, Take 1 capsule (4.5 mg total) by mouth daily., Disp: 30 capsule, Rfl: 1   celecoxib (CELEBREX) 200 MG capsule, Take 200 mg by mouth 2 (two) times daily as needed., Disp: , Rfl:    cloNIDine (CATAPRES) 0.1 MG tablet, TAKE 1 TABLET BY MOUTH 2 TIMES DAILY, Disp: 60 tablet, Rfl: 0   divalproex (DEPAKOTE ER) 500 MG 24 hr tablet, Take 3 tablets (1,500 mg total) by mouth at bedtime., Disp: 270 tablet, Rfl: 0   gabapentin (NEURONTIN) 300 MG capsule, SMARTSIG:2-3 Capsule(s) By Mouth 3 Times Daily, Disp: , Rfl:    LORazepam (ATIVAN) 1 MG tablet, Take 1 tablet (1 mg total) by mouth 3 (three) times daily as needed for anxiety., Disp: 90 tablet, Rfl: 2   oxyCODONE-acetaminophen (PERCOCET) 10-325 MG tablet, Take 1 tablet by mouth every 6 (six) hours as needed., Disp: , Rfl:    QUEtiapine (SEROQUEL) 25 MG tablet, Take by mouth., Disp: , Rfl:    QUEtiapine (SEROQUEL) 50 MG tablet, Take 1 tablet (50 mg total) by mouth at bedtime., Disp: 90 tablet, Rfl: 1   sertraline (ZOLOFT) 100 MG tablet, Take 2 tablets (200 mg total) by mouth every morning., Disp: 90 tablet, Rfl: 1  triamcinolone cream (KENALOG) 0.1 %, Apply topically 2 (two) times daily., Disp: , Rfl:    VRAYLAR 3 MG capsule, TAKE 1 CAPSULE BY MOUTH ONCE DAILY, Disp: 30 capsule, Rfl: 1 Medication Side Effects: none  Family Medical/ Social History: Changes? No  MENTAL HEALTH EXAM:  There were no vitals taken for this visit.There is no height or weight on file to calculate BMI.  General Appearance: Casual  Eye Contact:  Good  Speech:  Clear and Coherent  Volume:  Normal  Mood:  NA  Affect:  Appropriate  Thought Process:  Coherent  Orientation:   Full (Time, Place, and Person)  Thought Content: Logical   Suicidal Thoughts:  No  Homicidal Thoughts:  No  Memory:  WNL  Judgement:  Good  Insight:  Good  Psychomotor Activity:  Normal  Concentration:  Concentration: Good  Recall:  Good  Fund of Knowledge: Good  Language: Good  Assets:  Desire for Improvement  ADL's:  Intact  Cognition: WNL  Prognosis:  Good    DIAGNOSES:    ICD-10-CM   1. High risk medication use  Z79.899     2. Attention deficit hyperactivity disorder (ADHD), combined type  F90.2     3. Generalized anxiety disorder  F41.1     4. Bipolar depression ([place])  F31.9     5. Chronic fatigue  R53.82     6. Bipolar I disorder ([place])  F31.9     7. Insomnia due to other mental disorder  F51.05    F99       Receiving Psychotherapy: Yes    RECOMMENDATIONS:   To continue Adderall 30 mg XR  daily Continue Adderall 20 mg IR daily in afternoon Stopped Klonopin Continue Depakote 1000 mg at bedtime To reduce Gabapentin to 300 mg once in the am, 300 mg in the afternoon, and 600 mg at bedtime (total 1200 mg daily). Pt said he was taking 1800 mg daily as of today.  Greater than 50% of  30  min face to face time with patient was spent on counseling and coordination of care.  We discussed his recent quitting his job. He is now looking for new job but feels like pressure and stress has been relieved.  We discussed plan to continue to reducing his medication regimen. Reinforced again and discussed his long term use of benzodiazapine's. He is currently experiencing increased irritability due to unhappiness with work. Increased anxiety and depression. Reinforced potential benefits, risk, and side effects of benzodiazepines to include potential risk of tolerance and dependence, as well as possible drowsiness.  Advised patient not to drive if experiencing drowsiness and to take lowest possible effective dose to minimize risk of dependence and tolerance.  Discussed potential  benefits, risks, and side effects of stimulants with patient to include increased heart rate, palpitations, insomnia, increased anxiety, increased irritability, or decreased appetite.  Instructed patient to contact office if experiencing any significant tolerability issues.  Discussed potential metabolic side effects associated with atypical antipsychotics, as well as potential risk for movement side effects. Advised pt to contact office if movement side effects occur.     Elwanda Brooklyn, NP

## 2021-03-31 NOTE — Telephone Encounter (Signed)
Your last office note doesn't mention clonidine or sertraline?

## 2021-04-02 DIAGNOSIS — Z6837 Body mass index (BMI) 37.0-37.9, adult: Secondary | ICD-10-CM | POA: Diagnosis not present

## 2021-04-02 DIAGNOSIS — R030 Elevated blood-pressure reading, without diagnosis of hypertension: Secondary | ICD-10-CM | POA: Diagnosis not present

## 2021-04-02 DIAGNOSIS — Z79899 Other long term (current) drug therapy: Secondary | ICD-10-CM | POA: Diagnosis not present

## 2021-04-02 DIAGNOSIS — F39 Unspecified mood [affective] disorder: Secondary | ICD-10-CM | POA: Diagnosis not present

## 2021-04-02 DIAGNOSIS — M545 Low back pain, unspecified: Secondary | ICD-10-CM | POA: Diagnosis not present

## 2021-04-02 DIAGNOSIS — Z6838 Body mass index (BMI) 38.0-38.9, adult: Secondary | ICD-10-CM | POA: Diagnosis not present

## 2021-04-02 DIAGNOSIS — G8929 Other chronic pain: Secondary | ICD-10-CM | POA: Diagnosis not present

## 2021-04-13 ENCOUNTER — Telehealth: Payer: Self-pay | Admitting: Behavioral Health

## 2021-04-13 ENCOUNTER — Other Ambulatory Visit: Payer: Self-pay

## 2021-04-13 MED ORDER — AMPHETAMINE-DEXTROAMPHET ER 30 MG PO CP24: 30.0000 mg | ORAL_CAPSULE | Freq: Every morning | ORAL | 0 refills | Status: DC

## 2021-04-13 NOTE — Telephone Encounter (Signed)
Next visit is 06/28/21. Requesting refills on Adderall XR 30 mg and Lorazepam 1 mg called to:  [facility] [phone] - [place], [place] - [address] AT [facility] [address]  Phone:  [phone]  Fax:  [phone]    [name] checked with the pharmacy and they do have Adderall 30 mg ER in stock.

## 2021-04-13 NOTE — Telephone Encounter (Signed)
Pended adderall.Ativan filled 2/8 due 3/8

## 2021-04-15 ENCOUNTER — Other Ambulatory Visit: Payer: Self-pay | Admitting: Behavioral Health

## 2021-04-15 DIAGNOSIS — F902 Attention-deficit hyperactivity disorder, combined type: Secondary | ICD-10-CM

## 2021-04-19 ENCOUNTER — Telehealth: Payer: Self-pay

## 2021-04-19 NOTE — Telephone Encounter (Signed)
Prior Authorization initiated for AMPHETAMINE-DEXTROAMPHETAMINE ER 30 MG #30 with [facility]  ?

## 2021-04-20 NOTE — Telephone Encounter (Signed)
Prior Approval received effective 04/19/2021-04/18/2024 with [facility] for AMPHETAMINE DEXTROAMPHETAMINE ER 30 MG  ?

## 2021-04-21 ENCOUNTER — Other Ambulatory Visit: Payer: Self-pay

## 2021-04-21 ENCOUNTER — Telehealth: Payer: Self-pay | Admitting: Behavioral Health

## 2021-04-21 DIAGNOSIS — F902 Attention-deficit hyperactivity disorder, combined type: Secondary | ICD-10-CM

## 2021-04-21 DIAGNOSIS — F331 Major depressive disorder, recurrent, moderate: Secondary | ICD-10-CM

## 2021-04-21 DIAGNOSIS — F411 Generalized anxiety disorder: Secondary | ICD-10-CM

## 2021-04-21 DIAGNOSIS — F319 Bipolar disorder, unspecified: Secondary | ICD-10-CM

## 2021-04-21 MED ORDER — AMPHETAMINE-DEXTROAMPHET ER 30 MG PO CP24: ORAL_CAPSULE | ORAL | 0 refills | Status: DC

## 2021-04-21 MED ORDER — LORAZEPAM 1 MG PO TABS: 1.0000 mg | ORAL_TABLET | Freq: Three times a day (TID) | ORAL | 1 refills | Status: DC | PRN

## 2021-04-21 NOTE — Telephone Encounter (Signed)
Disregard,he is taking both and filled 30 mg on 1/16,will pend to provider  ?

## 2021-04-21 NOTE — Telephone Encounter (Signed)
20 mg was filled on 2/20.He won't be able to get 30 mg until 3/20 ?

## 2021-04-21 NOTE — Telephone Encounter (Signed)
Pended.

## 2021-04-21 NOTE — Telephone Encounter (Signed)
Next visit is 06/28/21. Oscar Weber is requesting his prescription for Amphetamine Dextroamphetamine to be called to:  [facility], [address], [place], [place] [address]. Phone number is [phone]. Prior authorization was approved. See previous message in chart about prior auth ?

## 2021-05-10 ENCOUNTER — Other Ambulatory Visit: Payer: Self-pay | Admitting: Behavioral Health

## 2021-05-10 ENCOUNTER — Telehealth: Payer: Self-pay | Admitting: Behavioral Health

## 2021-05-10 DIAGNOSIS — F902 Attention-deficit hyperactivity disorder, combined type: Secondary | ICD-10-CM

## 2021-05-10 MED ORDER — AMPHETAMINE-DEXTROAMPHETAMINE 20 MG PO TABS: 20.0000 mg | ORAL_TABLET | Freq: Every day | ORAL | 0 refills | Status: DC

## 2021-05-10 NOTE — Telephone Encounter (Signed)
Next visit is 06/28/21. Martavius called requesting a refill on his Adderall 20 mg called to: ? ?[id number] - [place] [employer], [place] - [address] AT [facility] [address] ? ?Phone:  [phone]  ?Fax:  [phone]  ? ? ? ? ? ?

## 2021-05-10 NOTE — Telephone Encounter (Signed)
Rx sent. No further action needed.

## 2021-05-11 NOTE — Telephone Encounter (Signed)
Pt called at 2:10 pm stating that [facility] [address] due to his insurance. So please delete all [facility] off of the preffered pharmacies. C[facility] and [facility] [address]. ?

## 2021-05-12 ENCOUNTER — Other Ambulatory Visit: Payer: Self-pay | Admitting: Behavioral Health

## 2021-05-12 DIAGNOSIS — F902 Attention-deficit hyperactivity disorder, combined type: Secondary | ICD-10-CM

## 2021-05-12 MED ORDER — AMPHETAMINE-DEXTROAMPHETAMINE 20 MG PO TABS: 20.0000 mg | ORAL_TABLET | Freq: Every day | ORAL | 0 refills | Status: DC

## 2021-05-12 NOTE — Telephone Encounter (Signed)
RX sent

## 2021-05-12 NOTE — Telephone Encounter (Signed)
Pt called again at 3:44 pm stating that his adderall 20 mg needs to [facility] [address] due to [facility]. He is out of medicine now and needs to fill as soon as script is sent. ?

## 2021-05-20 ENCOUNTER — Other Ambulatory Visit: Payer: Self-pay

## 2021-05-20 ENCOUNTER — Other Ambulatory Visit: Payer: Self-pay | Admitting: Behavioral Health

## 2021-05-20 DIAGNOSIS — F5105 Insomnia due to other mental disorder: Secondary | ICD-10-CM

## 2021-05-20 DIAGNOSIS — F902 Attention-deficit hyperactivity disorder, combined type: Secondary | ICD-10-CM

## 2021-05-20 DIAGNOSIS — F411 Generalized anxiety disorder: Secondary | ICD-10-CM

## 2021-05-20 MED ORDER — QUETIAPINE FUMARATE 50 MG PO TABS: 50.0000 mg | ORAL_TABLET | Freq: Every day | ORAL | 1 refills | Status: DC

## 2021-05-20 MED ORDER — AMPHETAMINE-DEXTROAMPHET ER 30 MG PO CP24: ORAL_CAPSULE | ORAL | 0 refills | Status: DC

## 2021-05-31 ENCOUNTER — Ambulatory Visit (INDEPENDENT_AMBULATORY_CARE_PROVIDER_SITE_OTHER): Payer: BC Managed Care – PPO | Admitting: Behavioral Health

## 2021-05-31 ENCOUNTER — Encounter: Payer: Self-pay | Admitting: Behavioral Health

## 2021-05-31 DIAGNOSIS — F411 Generalized anxiety disorder: Secondary | ICD-10-CM

## 2021-05-31 DIAGNOSIS — Z79899 Other long term (current) drug therapy: Secondary | ICD-10-CM | POA: Diagnosis not present

## 2021-05-31 DIAGNOSIS — F99 Mental disorder, not otherwise specified: Secondary | ICD-10-CM

## 2021-05-31 DIAGNOSIS — F319 Bipolar disorder, unspecified: Secondary | ICD-10-CM | POA: Diagnosis not present

## 2021-05-31 DIAGNOSIS — F331 Major depressive disorder, recurrent, moderate: Secondary | ICD-10-CM

## 2021-05-31 DIAGNOSIS — F902 Attention-deficit hyperactivity disorder, combined type: Secondary | ICD-10-CM | POA: Diagnosis not present

## 2021-05-31 DIAGNOSIS — F5105 Insomnia due to other mental disorder: Secondary | ICD-10-CM

## 2021-05-31 MED ORDER — CARIPRAZINE HCL 1.5 MG PO CAPS: 1.5000 mg | ORAL_CAPSULE | Freq: Every day | ORAL | 3 refills | Status: DC

## 2021-05-31 MED ORDER — LORAZEPAM 1 MG PO TABS: 1.0000 mg | ORAL_TABLET | Freq: Three times a day (TID) | ORAL | 1 refills | Status: DC | PRN

## 2021-05-31 MED ORDER — AMPHETAMINE-DEXTROAMPHET ER 30 MG PO CP24: 30.0000 mg | ORAL_CAPSULE | Freq: Every day | ORAL | 0 refills | Status: DC

## 2021-05-31 MED ORDER — QUETIAPINE FUMARATE 50 MG PO TABS: 50.0000 mg | ORAL_TABLET | Freq: Every day | ORAL | 2 refills | Status: DC

## 2021-05-31 MED ORDER — AMPHETAMINE-DEXTROAMPHETAMINE 30 MG PO TABS: 30.0000 mg | ORAL_TABLET | Freq: Every day | ORAL | 0 refills | Status: DC

## 2021-05-31 NOTE — Progress Notes (Signed)
[facility] ? ?Patient ID: Jodi MarbleMichael M Hence,  ?MRN: [id number] ? ?PCP: Carmel Sacramentoerry, [place], NP ? ?Date of Evaluation: 05/31/2021 ?Time spent:30 minutes ? ?Chief Complaint:  ?Chief Complaint   ?Anxiety; Depression; Follow-up; Medication Refill; Medication Problem ?  ? ? ?HISTORY/CURRENT STATUS: ?HPI ? ?41 year old male presents to this office for follow up and medication management. He says that he continues to feel much better with the Vraylar 3 mg. Says that when he was taking 4.5 mg it made him completely numb to any emotions. Says that even though he has some irritability and agitation, he feels much more balanced and controlled than before. He will be starting a new job next week. It is temp for 5 months but can become perm. He would like to continue to wean off Gabapentin and Depakote. Discuss reducing next visit.  He reports his anxiety today is 3/10 and depression 2/10. He is sleeping 7-8 hours per night with aid of Seroquel. No mania present, no psychosis. No SI/HI. ?  ?Past psychiatric medication trials: ?Zoloft ?Prozac ?Celexa ?Lexapro ?Paxil ?Wellbutrin ?Remeron ?Seroquel ?Geodon ?Depakote  ?  ? ?Individual Medical History/ Review of Systems: Changes? :No  ? ?Allergies: Bupropion and Gadolinium derivatives ? ?Current Medications:  ?Current Outpatient Medications:  ?  [START ON 06/22/2021] amphetamine-dextroamphetamine (ADDERALL XR) 30 MG 24 hr capsule, Take 1 capsule (30 mg total) by mouth daily., Disp: 30 capsule, Rfl: 0 ?  [START ON 06/22/2021] amphetamine-dextroamphetamine (ADDERALL) 30 MG tablet, Take 1 tablet by mouth daily., Disp: 30 tablet, Rfl: 0 ?  cariprazine (VRAYLAR) 1.5 MG capsule, Take 1 capsule (1.5 mg total) by mouth daily., Disp: 30 capsule, Rfl: 3 ?  albuterol (VENTOLIN HFA) 108 (90 Base) MCG/ACT inhaler, Inhale 1-2 puffs into the lungs every 4 (four) hours as needed for wheezing or shortness of breath., Disp: 6.7 g, Rfl: 0 ?  amphetamine-dextroamphetamine (ADDERALL XR) 30 MG 24 hr  capsule, Take 1 capsule (30 mg total) by mouth every morning., Disp: 30 capsule, Rfl: 0 ?  amphetamine-dextroamphetamine (ADDERALL XR) 30 MG 24 hr capsule, TAKE 1 CAPSULE BY MOUTH EVERY DAY, Disp: 30 capsule, Rfl: 0 ?  amphetamine-dextroamphetamine (ADDERALL) 20 MG tablet, Take 1 tablet (20 mg) by mouth daily in the afternoon, Disp: 30 tablet, Rfl: 0 ?  amphetamine-dextroamphetamine (ADDERALL) 20 MG tablet, Take 1 tablet (20 mg total) by mouth daily for 30 doses., Disp: 30 tablet, Rfl: 0 ?  cariprazine (VRAYLAR) 3 MG capsule, Take 1 capsule (3 mg total) by mouth daily., Disp: 30 capsule, Rfl: 3 ?  Cariprazine HCl (VRAYLAR) 4.5 MG CAPS, Take 1 capsule (4.5 mg total) by mouth daily., Disp: 30 capsule, Rfl: 1 ?  celecoxib (CELEBREX) 200 MG capsule, Take 200 mg by mouth 2 (two) times daily as needed., Disp: , Rfl:  ?  cloNIDine (CATAPRES) 0.1 MG tablet, TAKE 1 TABLET BY MOUTH 2 TIMES DAILY, Disp: 60 tablet, Rfl: 0 ?  divalproex (DEPAKOTE ER) 500 MG 24 hr tablet, Take 3 tablets (1,500 mg total) by mouth at bedtime., Disp: 270 tablet, Rfl: 0 ?  gabapentin (NEURONTIN) 300 MG capsule, SMARTSIG:2-3 Capsule(s) By Mouth 3 Times Daily, Disp: , Rfl:  ?  [START ON 06/18/2021] LORazepam (ATIVAN) 1 MG tablet, Take 1 tablet (1 mg total) by mouth 3 (three) times daily as needed for anxiety., Disp: 270 tablet, Rfl: 1 ?  oxyCODONE-acetaminophen (PERCOCET) 10-325 MG tablet, Take 1 tablet by mouth every 6 (six) hours as needed., Disp: , Rfl:  ?  QUEtiapine (SEROQUEL) 50  MG tablet, Take 1 tablet (50 mg total) by mouth at bedtime., Disp: 90 tablet, Rfl: 2 ?  sertraline (ZOLOFT) 100 MG tablet, TAKE 2 TABLETS BY MOUTH EVERY MORNING, Disp: 90 tablet, Rfl: 1 ?  triamcinolone cream (KENALOG) 0.1 %, Apply topically 2 (two) times daily., Disp: , Rfl:  ?  VRAYLAR 3 MG capsule, TAKE 1 CAPSULE BY MOUTH ONCE DAILY, Disp: 30 capsule, Rfl: 1 ?Medication Side Effects: none ? ?Family Medical/ Social History: Changes? No ? ?MENTAL HEALTH EXAM: ? ?There were  no vitals taken for this visit.There is no height or weight on file to calculate BMI.  ?General Appearance: Casual, Neat, and Well Groomed  ?Eye Contact:  Good  ?Speech:  Clear and Coherent  ?Volume:  Normal  ?Mood:  NA  ?Affect:  Appropriate  ?Thought Process:  Coherent  ?Orientation:  Full (Time, Place, and Person)  ?Thought Content: Logical   ?Suicidal Thoughts:  No  ?Homicidal Thoughts:  No  ?Memory:  WNL  ?Judgement:  Good  ?Insight:  Good  ?Psychomotor Activity:  Normal  ?Concentration:  Concentration: Good  ?Recall:  Good  ?Fund of Knowledge: Good  ?Language: Good  ?Assets:  Desire for Improvement  ?ADL's:  Intact  ?Cognition: WNL  ?Prognosis:  Good  ? ? ?DIAGNOSES:  ?  ICD-10-CM   ?1. Attention deficit hyperactivity disorder (ADHD), combined type  F90.2 amphetamine-dextroamphetamine (ADDERALL XR) 30 MG 24 hr capsule  ?  amphetamine-dextroamphetamine (ADDERALL) 30 MG tablet  ?  ?2. High risk medication use  Z79.899   ?  ?3. Generalized anxiety disorder  F41.1 cariprazine (VRAYLAR) 1.5 MG capsule  ?  LORazepam (ATIVAN) 1 MG tablet  ?  QUEtiapine (SEROQUEL) 50 MG tablet  ?  ?4. Bipolar depression (HCC)  F31.9 cariprazine (VRAYLAR) 1.5 MG capsule  ?  LORazepam (ATIVAN) 1 MG tablet  ?  ?5. Insomnia due to other mental disorder  F51.05 QUEtiapine (SEROQUEL) 50 MG tablet  ? F99   ?  ?6. Major depressive disorder, recurrent episode, moderate (HCC)  F33.1 cariprazine (VRAYLAR) 1.5 MG capsule  ?  LORazepam (ATIVAN) 1 MG tablet  ?  ? ? ?Receiving Psychotherapy: Yes ? ? ?RECOMMENDATIONS:  ? ?To continue Adderall 30 mg XR  daily ?Continue Adderall 20 mg IR daily in afternoon ?Continue Ativan 1 mg 3 times daily ?Continue Depakote 1000 mg at bedtime ?Continue Gabapentin 300 mg once in the am, 300 mg in the afternoon, and 600 mg at bedtime (total 1200 mg daily). ?Greater than 50% of  30  min face to face time with patient was spent on counseling and coordination of care.  ?We discussed his recent quitting his job. He is now  looking for new job but feels like pressure and stress has been relieved.  ?We discussed plan to continue to reducing his medication regimen. ?Reinforced again and discussed his long term use of benzodiazapine's. He is currently experiencing increased irritability due to unhappiness with work. Increased anxiety and depression. Reinforced potential benefits, risk, and side effects of benzodiazepines to include potential risk of tolerance and dependence, as well as possible drowsiness.  Advised patient not to drive if experiencing drowsiness and to take lowest possible effective dose to minimize risk of dependence and tolerance.  ?Discussed potential benefits, risks, and side effects of stimulants with patient to include increased heart rate, palpitations, insomnia, increased anxiety, increased irritability, or decreased appetite.  Instructed patient to contact office if experiencing any significant tolerability issues.  ?Discussed potential metabolic side effects associated with  atypical antipsychotics, as well as potential risk for movement side effects. Advised pt to contact office if movement side effects occur.  ? ?Check Depakote Levels Next Visit. ?  ? ?Joan Flores, NP  ?

## 2021-06-28 ENCOUNTER — Ambulatory Visit: Payer: BC Managed Care – PPO | Admitting: Behavioral Health

## 2021-07-10 ENCOUNTER — Other Ambulatory Visit: Payer: Self-pay | Admitting: Behavioral Health

## 2021-07-10 DIAGNOSIS — F319 Bipolar disorder, unspecified: Secondary | ICD-10-CM

## 2021-07-11 NOTE — Telephone Encounter (Signed)
Verify dosing, note shows 1000, Last Rx shows 1500

## 2021-07-22 ENCOUNTER — Other Ambulatory Visit: Payer: Self-pay

## 2021-07-22 ENCOUNTER — Telehealth: Payer: Self-pay | Admitting: Behavioral Health

## 2021-07-22 DIAGNOSIS — F902 Attention-deficit hyperactivity disorder, combined type: Secondary | ICD-10-CM

## 2021-07-22 MED ORDER — AMPHETAMINE-DEXTROAMPHETAMINE 30 MG PO TABS: 30.0000 mg | ORAL_TABLET | Freq: Every day | ORAL | 0 refills | Status: DC

## 2021-07-22 MED ORDER — AMPHETAMINE-DEXTROAMPHET ER 30 MG PO CP24: 30.0000 mg | ORAL_CAPSULE | Freq: Every morning | ORAL | 0 refills | Status: DC

## 2021-07-22 NOTE — Telephone Encounter (Signed)
Pended.

## 2021-07-22 NOTE — Telephone Encounter (Signed)
Pt requesting RF generic Adderall XR 30 mg and generic Adderall 30 mg. [facility] [address]

## 2021-08-05 ENCOUNTER — Other Ambulatory Visit: Payer: Self-pay | Admitting: Behavioral Health

## 2021-08-05 DIAGNOSIS — F411 Generalized anxiety disorder: Secondary | ICD-10-CM

## 2021-08-05 DIAGNOSIS — F319 Bipolar disorder, unspecified: Secondary | ICD-10-CM

## 2021-08-26 ENCOUNTER — Telehealth: Payer: Self-pay | Admitting: Behavioral Health

## 2021-08-26 ENCOUNTER — Other Ambulatory Visit: Payer: Self-pay

## 2021-08-26 DIAGNOSIS — F902 Attention-deficit hyperactivity disorder, combined type: Secondary | ICD-10-CM

## 2021-08-26 MED ORDER — AMPHETAMINE-DEXTROAMPHETAMINE 30 MG PO TABS: 30.0000 mg | ORAL_TABLET | Freq: Every day | ORAL | 0 refills | Status: DC

## 2021-08-26 MED ORDER — AMPHETAMINE-DEXTROAMPHET ER 30 MG PO CP24: 30.0000 mg | ORAL_CAPSULE | Freq: Every morning | ORAL | 0 refills | Status: DC

## 2021-08-26 NOTE — Telephone Encounter (Signed)
Pended.

## 2021-08-26 NOTE — Telephone Encounter (Signed)
Next visit is 08/30/21. Oscar Weber is requesting refills on his Adderall XR 30 mg and Adderall IR 30 mg called to:  [facility], [address] [place], [place], [place] [address]. Phone number is  [phone]  Per patient it is in stock.

## 2021-08-30 ENCOUNTER — Ambulatory Visit (INDEPENDENT_AMBULATORY_CARE_PROVIDER_SITE_OTHER): Payer: BC Managed Care – PPO | Admitting: Behavioral Health

## 2021-08-30 ENCOUNTER — Encounter: Payer: Self-pay | Admitting: Behavioral Health

## 2021-08-30 DIAGNOSIS — F411 Generalized anxiety disorder: Secondary | ICD-10-CM

## 2021-08-30 DIAGNOSIS — F5105 Insomnia due to other mental disorder: Secondary | ICD-10-CM

## 2021-08-30 DIAGNOSIS — F99 Mental disorder, not otherwise specified: Secondary | ICD-10-CM | POA: Diagnosis not present

## 2021-08-30 MED ORDER — QUETIAPINE FUMARATE 50 MG PO TABS: 100.0000 mg | ORAL_TABLET | Freq: Every day | ORAL | 1 refills | Status: DC

## 2021-08-30 MED ORDER — DIVALPROEX SODIUM 500 MG PO DR TAB: 500.0000 mg | DELAYED_RELEASE_TABLET | Freq: Every evening | ORAL | 3 refills | Status: DC

## 2021-08-30 MED ORDER — QUETIAPINE FUMARATE 100 MG PO TABS: 100.0000 mg | ORAL_TABLET | Freq: Every day | ORAL | 1 refills | Status: DC

## 2021-08-30 NOTE — Progress Notes (Deleted)
[facility]  Patient ID: Oscar Weber,  MRN: [id number]  PCP: Carmel [place], NP  Date of Evaluation: 08/30/2021 Time spent:30 minutes  Chief Complaint:   HISTORY/CURRENT STATUS: HPI  Individual Medical History/ Review of Systems: Changes? :No   Allergies: Bupropion and Gadolinium derivatives  Current Medications:  Current Outpatient Medications:    divalproex (DEPAKOTE) 500 MG DR tablet, Take 1 tablet (500 mg total) by mouth at bedtime., Disp: 30 tablet, Rfl: 3   albuterol (VENTOLIN HFA) 108 (90 Base) MCG/ACT inhaler, Inhale 1-2 puffs into the lungs every 4 (four) hours as needed for wheezing or shortness of breath., Disp: 6.7 g, Rfl: 0   amphetamine-dextroamphetamine (ADDERALL XR) 30 MG 24 hr capsule, TAKE 1 CAPSULE BY MOUTH EVERY DAY, Disp: 30 capsule, Rfl: 0   amphetamine-dextroamphetamine (ADDERALL XR) 30 MG 24 hr capsule, Take 1 capsule (30 mg total) by mouth daily., Disp: 30 capsule, Rfl: 0   amphetamine-dextroamphetamine (ADDERALL XR) 30 MG 24 hr capsule, Take 1 capsule (30 mg total) by mouth every morning., Disp: 30 capsule, Rfl: 0   amphetamine-dextroamphetamine (ADDERALL) 20 MG tablet, Take 1 tablet (20 mg) by mouth daily in the afternoon, Disp: 30 tablet, Rfl: 0   amphetamine-dextroamphetamine (ADDERALL) 20 MG tablet, Take 1 tablet (20 mg total) by mouth daily for 30 doses., Disp: 30 tablet, Rfl: 0   amphetamine-dextroamphetamine (ADDERALL) 30 MG tablet, Take 1 tablet by mouth daily., Disp: 30 tablet, Rfl: 0   cariprazine (VRAYLAR) 1.5 MG capsule, Take 1 capsule (1.5 mg total) by mouth daily., Disp: 30 capsule, Rfl: 3   cariprazine (VRAYLAR) 3 MG capsule, Take 1 capsule (3 mg total) by mouth daily., Disp: 30 capsule, Rfl: 3   Cariprazine HCl (VRAYLAR) 4.5 MG CAPS, Take 1 capsule (4.5 mg total) by mouth daily., Disp: 30 capsule, Rfl: 1   celecoxib (CELEBREX) 200 MG capsule, Take 200 mg by mouth 2 (two) times daily as needed., Disp: , Rfl:    cloNIDine  (CATAPRES) 0.1 MG tablet, TAKE 1 TABLET BY MOUTH 2 TIMES DAILY, Disp: 60 tablet, Rfl: 0   divalproex (DEPAKOTE ER) 500 MG 24 hr tablet, Take 3 tablets (1,500 mg total) by mouth at bedtime., Disp: 270 tablet, Rfl: 0   LORazepam (ATIVAN) 1 MG tablet, Take 1 tablet (1 mg total) by mouth 3 (three) times daily as needed for anxiety., Disp: 270 tablet, Rfl: 1   oxyCODONE-acetaminophen (PERCOCET) 10-325 MG tablet, Take 1 tablet by mouth every 6 (six) hours as needed., Disp: , Rfl:    QUEtiapine (SEROQUEL) 50 MG tablet, Take 2 tablets (100 mg total) by mouth at bedtime., Disp: 180 tablet, Rfl: 1   sertraline (ZOLOFT) 100 MG tablet, TAKE 2 TABLETS EACH MORNING, Disp: 180 tablet, Rfl: 0   triamcinolone cream (KENALOG) 0.1 %, Apply topically 2 (two) times daily., Disp: , Rfl:  Medication Side Effects: none  Family Medical/ Social History: Changes? No  MENTAL HEALTH EXAM:  There were no vitals taken for this visit.There is no height or weight on file to calculate BMI.  General Appearance: Casual, Neat, and Well Groomed  Eye Contact:  Good  Speech:  Clear and Coherent  Volume:  Normal  Mood:  NA  Affect:  Appropriate  Thought Process:  Coherent  Orientation:  Full (Time, Place, and Person)  Thought Content: Logical   Suicidal Thoughts:  No  Homicidal Thoughts:  No  Memory:  WNL  Judgement:  Good  Insight:  Good  Psychomotor Activity:  Normal  Concentration:  Concentration: Good  Recall:  Good  Fund of Knowledge: Good  Language: Good  Assets:  Desire for Improvement  ADL's:  Intact  Cognition: WNL  Prognosis:  Good    DIAGNOSES:    ICD-10-CM   1. Generalized anxiety disorder  F41.1 divalproex (DEPAKOTE) 500 MG DR tablet    QUEtiapine (SEROQUEL) 50 MG tablet    2. Insomnia due to other mental disorder  F51.05 divalproex (DEPAKOTE) 500 MG DR tablet   F99 QUEtiapine (SEROQUEL) 50 MG tablet      Receiving Psychotherapy: No    RECOMMENDATIONS:        Joan Flores, NP

## 2021-08-30 NOTE — Progress Notes (Addendum)
[facility]  Patient ID: Oscar Weber,  MRN: [id number]  PCP: Carmel [place], NP  Date of Evaluation: 08/30/2021 Time spent:30 minutes  Chief Complaint:  Chief Complaint   Anxiety; Depression; Follow-up; Medication Refill; ADHD; Irritability     HISTORY/CURRENT STATUS: HPI  41 year old male presents to this office for follow up and medication management. He says that he continues to feel much improved with the Vraylar 1.5 without the fatigue he had from 3 mg. Says that when he was taking 4.5 mg it made him completely numb to any emotions. Says that even though he has some irritability and agitation, he feels much more balanced and controlled than before. He would like to increase his Seroquel to 100 mg at bedtime for sleep.  He would like to continue to wean off Gabapentin and Depakote. Discuss reducing next visit.  He reports his anxiety today is 3/10 and depression 2/10. He is sleeping 7-8 hours per night with aid of Seroquel. No mania present, no psychosis. No SI/HI.   Past psychiatric medication trials: Zoloft Prozac Celexa Lexapro Paxil Wellbutrin Remeron Seroquel Geodon Depakote    Individual Medical History/ Review of Systems: Changes? :No   Allergies: Bupropion and Gadolinium derivatives  Current Medications:  Current Outpatient Medications:    divalproex (DEPAKOTE) 500 MG DR tablet, Take 1 tablet (500 mg total) by mouth at bedtime., Disp: 30 tablet, Rfl: 3   QUEtiapine (SEROQUEL) 100 MG tablet, Take 1 tablet (100 mg total) by mouth at bedtime., Disp: 90 tablet, Rfl: 1   albuterol (VENTOLIN HFA) 108 (90 Base) MCG/ACT inhaler, Inhale 1-2 puffs into the lungs every 4 (four) hours as needed for wheezing or shortness of breath., Disp: 6.7 g, Rfl: 0   amphetamine-dextroamphetamine (ADDERALL XR) 30 MG 24 hr capsule, TAKE 1 CAPSULE BY MOUTH EVERY DAY, Disp: 30 capsule, Rfl: 0   amphetamine-dextroamphetamine (ADDERALL XR) 30 MG 24 hr capsule, Take 1 capsule  (30 mg total) by mouth daily., Disp: 30 capsule, Rfl: 0   amphetamine-dextroamphetamine (ADDERALL XR) 30 MG 24 hr capsule, Take 1 capsule (30 mg total) by mouth every morning., Disp: 30 capsule, Rfl: 0   amphetamine-dextroamphetamine (ADDERALL) 20 MG tablet, Take 1 tablet (20 mg) by mouth daily in the afternoon, Disp: 30 tablet, Rfl: 0   amphetamine-dextroamphetamine (ADDERALL) 20 MG tablet, Take 1 tablet (20 mg total) by mouth daily for 30 doses., Disp: 30 tablet, Rfl: 0   amphetamine-dextroamphetamine (ADDERALL) 30 MG tablet, Take 1 tablet by mouth daily., Disp: 30 tablet, Rfl: 0   cariprazine (VRAYLAR) 1.5 MG capsule, Take 1 capsule (1.5 mg total) by mouth daily., Disp: 30 capsule, Rfl: 3   cariprazine (VRAYLAR) 3 MG capsule, Take 1 capsule (3 mg total) by mouth daily., Disp: 30 capsule, Rfl: 3   Cariprazine HCl (VRAYLAR) 4.5 MG CAPS, Take 1 capsule (4.5 mg total) by mouth daily., Disp: 30 capsule, Rfl: 1   celecoxib (CELEBREX) 200 MG capsule, Take 200 mg by mouth 2 (two) times daily as needed., Disp: , Rfl:    cloNIDine (CATAPRES) 0.1 MG tablet, TAKE 1 TABLET BY MOUTH 2 TIMES DAILY, Disp: 60 tablet, Rfl: 0   divalproex (DEPAKOTE ER) 500 MG 24 hr tablet, Take 3 tablets (1,500 mg total) by mouth at bedtime., Disp: 270 tablet, Rfl: 0   LORazepam (ATIVAN) 1 MG tablet, Take 1 tablet (1 mg total) by mouth 3 (three) times daily as needed for anxiety., Disp: 270 tablet, Rfl: 1   oxyCODONE-acetaminophen (PERCOCET) 10-325 MG tablet,  Take 1 tablet by mouth every 6 (six) hours as needed., Disp: , Rfl:    sertraline (ZOLOFT) 100 MG tablet, TAKE 2 TABLETS EACH MORNING, Disp: 180 tablet, Rfl: 0   triamcinolone cream (KENALOG) 0.1 %, Apply topically 2 (two) times daily., Disp: , Rfl:  Medication Side Effects: none  Family Medical/ Social History: Changes? No  MENTAL HEALTH EXAM:  There were no vitals taken for this visit.There is no height or weight on file to calculate BMI.  General Appearance: Guarded   Eye Contact:  Good  Speech:  Clear and Coherent  Volume:  Normal  Mood:  Anxious and Depressed  Affect:  Congruent, Depressed, Flat, and Anxious  Thought Process:  Coherent  Orientation:  Full (Time, Place, and Person)  Thought Content: Logical   Suicidal Thoughts:  No  Homicidal Thoughts:  No  Memory:  WNL  Judgement:  Good  Insight:  Good  Psychomotor Activity:  Normal  Concentration:  Concentration: Good  Recall:  Good  Fund of Knowledge: Good  Language: Good  Assets:  Desire for Improvement  ADL's:  Intact  Cognition: WNL  Prognosis:  Good    DIAGNOSES:    ICD-10-CM   1. Generalized anxiety disorder  F41.1 divalproex (DEPAKOTE) 500 MG DR tablet    QUEtiapine (SEROQUEL) 100 MG tablet    DISCONTINUED: QUEtiapine (SEROQUEL) 50 MG tablet    2. Insomnia due to other mental disorder  F51.05 divalproex (DEPAKOTE) 500 MG DR tablet   F99 QUEtiapine (SEROQUEL) 100 MG tablet    DISCONTINUED: QUEtiapine (SEROQUEL) 50 MG tablet      Receiving Psychotherapy: No    RECOMMENDATIONS:   To continue Vraylar 3 mg daily. To continue Adderall 30 mg XR  daily Continue Adderall 20 mg IR daily in afternoon Continue Ativan 1 mg 3 times daily Continue to reduce Depakote, will take 250 mg daily until next visit.  Continue Gabapentin 300 mg once in the am, 300 mg in the afternoon, and 600 mg at bedtime (total 1200 mg daily). Increase Seroquel to 100 mg at bedtime. Continue Zoloft 200 mg daily.  Greater than 50% of  30  min face to face time with patient was spent on counseling and coordination of care.  We discussed his continued irritability but he continue to be very unhappy with his job situation and not happy living in [place].  We discussed plan to continue to reducing his medication regimen. Reinforced again and discussed his long term use of benzodiazapine's. He is currently experiencing increased irritability due to unhappiness with work. Increased anxiety and depression. Reinforced  potential benefits, risk, and side effects of benzodiazepines to include potential risk of tolerance and dependence, as well as possible drowsiness.  Advised patient not to drive if experiencing drowsiness and to take lowest possible effective dose to minimize risk of dependence and tolerance.  Discussed potential benefits, risks, and side effects of stimulants with patient to include increased heart rate, palpitations, insomnia, increased anxiety, increased irritability, or decreased appetite.  Instructed patient to contact office if experiencing any significant tolerability issues.  Discussed potential metabolic side effects associated with atypical antipsychotics, as well as potential risk for movement side effects. Advised pt to contact office if movement side effects occur.  Pt has only been taking 500 mg of Depakote and wishes to continue to wean off. No need to check levels at this time.        Joan Flores, NP

## 2021-09-20 ENCOUNTER — Other Ambulatory Visit: Payer: Self-pay | Admitting: Behavioral Health

## 2021-09-20 DIAGNOSIS — F5105 Insomnia due to other mental disorder: Secondary | ICD-10-CM

## 2021-09-20 DIAGNOSIS — F411 Generalized anxiety disorder: Secondary | ICD-10-CM

## 2021-09-29 ENCOUNTER — Telehealth: Payer: Self-pay | Admitting: Behavioral Health

## 2021-09-29 ENCOUNTER — Other Ambulatory Visit: Payer: Self-pay

## 2021-09-29 DIAGNOSIS — F902 Attention-deficit hyperactivity disorder, combined type: Secondary | ICD-10-CM

## 2021-09-29 MED ORDER — AMPHETAMINE-DEXTROAMPHETAMINE 20 MG PO TABS: 20.0000 mg | ORAL_TABLET | Freq: Every day | ORAL | 0 refills | Status: DC

## 2021-09-29 MED ORDER — AMPHETAMINE-DEXTROAMPHET ER 30 MG PO CP24: 30.0000 mg | ORAL_CAPSULE | Freq: Every morning | ORAL | 0 refills | Status: DC

## 2021-09-29 MED ORDER — AMPHETAMINE-DEXTROAMPHET ER 30 MG PO CP24: ORAL_CAPSULE | ORAL | 0 refills | Status: DC

## 2021-09-29 MED ORDER — AMPHETAMINE-DEXTROAMPHETAMINE 20 MG PO TABS: ORAL_TABLET | ORAL | 0 refills | Status: DC

## 2021-09-29 NOTE — Telephone Encounter (Signed)
Pended.

## 2021-09-29 NOTE — Telephone Encounter (Signed)
Pt lvm that he needs a refill on his adderall 30 mg and adderall 20 mg. [facility] [address]

## 2021-10-20 ENCOUNTER — Other Ambulatory Visit: Payer: Self-pay | Admitting: Behavioral Health

## 2021-10-20 DIAGNOSIS — F902 Attention-deficit hyperactivity disorder, combined type: Secondary | ICD-10-CM

## 2021-11-07 ENCOUNTER — Other Ambulatory Visit: Payer: Self-pay | Admitting: Behavioral Health

## 2021-11-07 DIAGNOSIS — F319 Bipolar disorder, unspecified: Secondary | ICD-10-CM

## 2021-11-07 DIAGNOSIS — F411 Generalized anxiety disorder: Secondary | ICD-10-CM

## 2021-11-14 ENCOUNTER — Other Ambulatory Visit: Payer: Self-pay | Admitting: Behavioral Health

## 2021-11-14 DIAGNOSIS — F319 Bipolar disorder, unspecified: Secondary | ICD-10-CM

## 2021-11-14 DIAGNOSIS — F331 Major depressive disorder, recurrent, moderate: Secondary | ICD-10-CM

## 2021-11-14 DIAGNOSIS — F411 Generalized anxiety disorder: Secondary | ICD-10-CM

## 2021-11-15 NOTE — Telephone Encounter (Signed)
Wife called this morning at 10:30 to request the refill of the Vraylar.  The [facility], [facility] sent in the refill but they don't have the medication in stock and he is out.  [facility] [address] [facility].  Appt 10/17.

## 2021-11-16 ENCOUNTER — Other Ambulatory Visit: Payer: Self-pay

## 2021-11-16 ENCOUNTER — Telehealth: Payer: Self-pay | Admitting: Behavioral Health

## 2021-11-16 DIAGNOSIS — F411 Generalized anxiety disorder: Secondary | ICD-10-CM

## 2021-11-16 DIAGNOSIS — F319 Bipolar disorder, unspecified: Secondary | ICD-10-CM

## 2021-11-16 DIAGNOSIS — F331 Major depressive disorder, recurrent, moderate: Secondary | ICD-10-CM

## 2021-11-16 MED ORDER — CARIPRAZINE HCL 3 MG PO CAPS: 3.0000 mg | ORAL_CAPSULE | Freq: Every day | ORAL | 0 refills | Status: DC

## 2021-11-16 MED ORDER — CARIPRAZINE HCL 1.5 MG PO CAPS: 1.5000 mg | ORAL_CAPSULE | Freq: Every day | ORAL | 3 refills | Status: DC

## 2021-11-16 NOTE — Telephone Encounter (Signed)
Rx sent 

## 2021-11-16 NOTE — Telephone Encounter (Signed)
Bracen's mom Holland Commons is at pharmacy now to pick up his Arman Filter and it is the wrong dose. She states it was 3 mg but it now needs to be 1.5 mg. She is at pharmacy now. Can someone please submit the correct dosage for his Vyvanse?  [id number] [place] [address]  [place], [place] [address]  Phone:  [phone]  Fax:  [phone]

## 2021-11-26 ENCOUNTER — Other Ambulatory Visit: Payer: Self-pay | Admitting: Behavioral Health

## 2021-11-26 ENCOUNTER — Other Ambulatory Visit: Payer: Self-pay

## 2021-11-26 DIAGNOSIS — F411 Generalized anxiety disorder: Secondary | ICD-10-CM

## 2021-11-26 DIAGNOSIS — F5105 Insomnia due to other mental disorder: Secondary | ICD-10-CM

## 2021-11-26 MED ORDER — DIVALPROEX SODIUM 250 MG PO DR TAB: 250.0000 mg | DELAYED_RELEASE_TABLET | Freq: Every day | ORAL | 0 refills | Status: DC

## 2021-11-30 ENCOUNTER — Telehealth (INDEPENDENT_AMBULATORY_CARE_PROVIDER_SITE_OTHER): Payer: Self-pay | Admitting: Behavioral Health

## 2021-11-30 DIAGNOSIS — F489 Nonpsychotic mental disorder, unspecified: Secondary | ICD-10-CM

## 2021-11-30 NOTE — Progress Notes (Signed)
Pt did not show for scheduled appt and did not provide 24 hours notice as required. Fees to be assessed.

## 2021-12-08 ENCOUNTER — Other Ambulatory Visit: Payer: Self-pay

## 2021-12-08 ENCOUNTER — Telehealth: Payer: Self-pay | Admitting: Behavioral Health

## 2021-12-08 DIAGNOSIS — F902 Attention-deficit hyperactivity disorder, combined type: Secondary | ICD-10-CM

## 2021-12-08 MED ORDER — AMPHETAMINE-DEXTROAMPHET ER 30 MG PO CP24: 30.0000 mg | ORAL_CAPSULE | Freq: Every day | ORAL | 0 refills | Status: DC

## 2021-12-08 MED ORDER — AMPHETAMINE-DEXTROAMPHETAMINE 20 MG PO TABS: ORAL_TABLET | ORAL | 0 refills | Status: DC

## 2021-12-08 NOTE — Telephone Encounter (Signed)
Refill lorazepam - due 10/28

## 2021-12-08 NOTE — Telephone Encounter (Signed)
Patient lvm at 11:43 for refill on Adderall 30mg  Adderall 20mg  and Lorazepam 1mg . Ph: [phone] [facility]acy [facility]

## 2021-12-08 NOTE — Telephone Encounter (Signed)
Pended Adderall. Lorazepam not due until 10/28.

## 2021-12-08 NOTE — Telephone Encounter (Signed)
Please call to schedule an appt, was a no show last appt.  

## 2021-12-13 ENCOUNTER — Other Ambulatory Visit: Payer: Self-pay | Admitting: Behavioral Health

## 2021-12-13 DIAGNOSIS — F319 Bipolar disorder, unspecified: Secondary | ICD-10-CM

## 2021-12-13 DIAGNOSIS — F411 Generalized anxiety disorder: Secondary | ICD-10-CM

## 2021-12-13 DIAGNOSIS — F331 Major depressive disorder, recurrent, moderate: Secondary | ICD-10-CM

## 2021-12-25 ENCOUNTER — Other Ambulatory Visit: Payer: Self-pay | Admitting: Behavioral Health

## 2021-12-27 ENCOUNTER — Encounter: Payer: Self-pay | Admitting: Behavioral Health

## 2021-12-27 NOTE — Telephone Encounter (Signed)
Please call to schedule appt.

## 2022-01-10 IMAGING — CR DG CHEST 2V
2 series · 2 of 2 positions shown · non-contrast
Comparison: No priors.

CLINICAL DATA: 40-year-old male with history of cough for 1 week.

EXAM:
CHEST - 2 VIEW

[w chest lat]
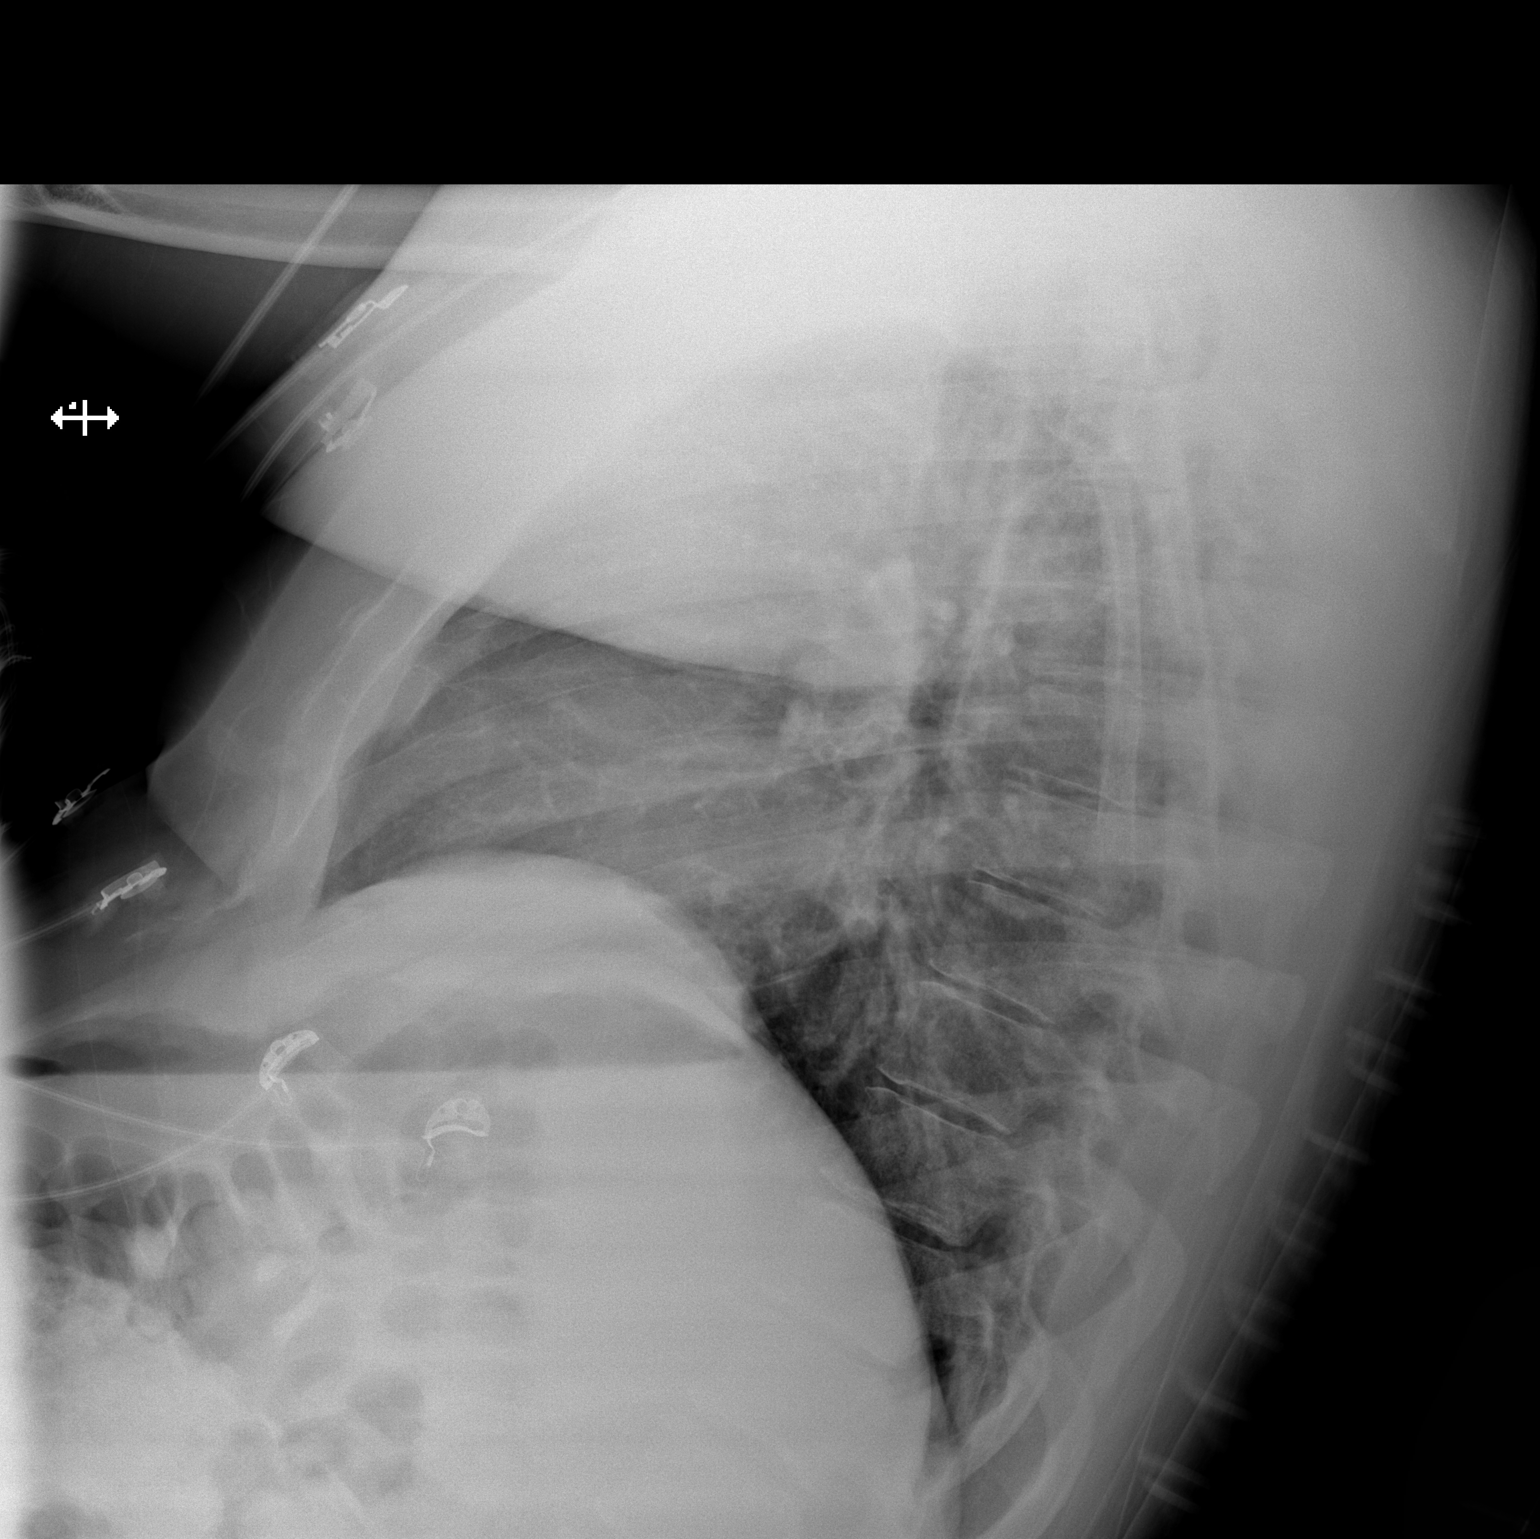

[x chest ap]
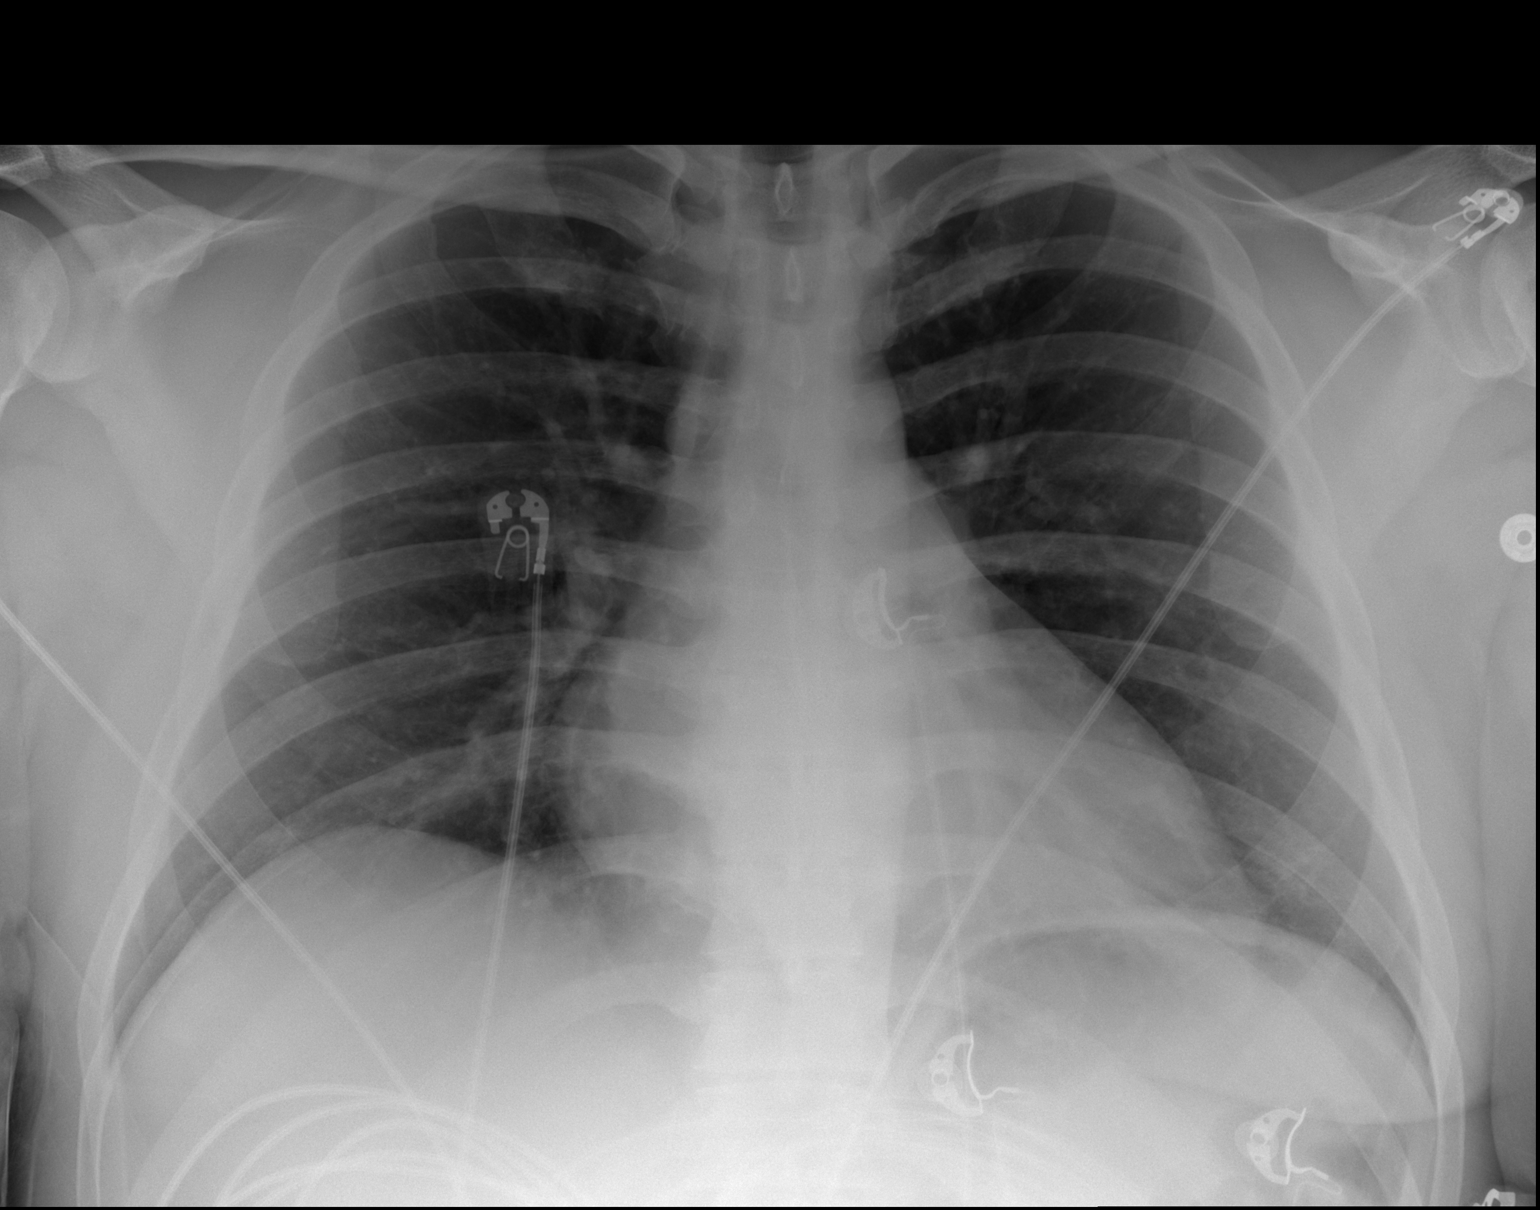

[2 of 2 positions shown; findings below may reference images not displayed]

FINDINGS: Lung volumes are normal. No consolidative airspace disease. No
pleural effusions. No pneumothorax. No pulmonary nodule or mass
noted. Pulmonary vasculature and the cardiomediastinal silhouette
are within normal limits.
IMPRESSION: No radiographic evidence of acute cardiopulmonary disease.

## 2022-01-14 NOTE — Telephone Encounter (Signed)
Will call back to schedule f/u

## 2022-01-21 ENCOUNTER — Other Ambulatory Visit: Payer: Self-pay

## 2022-01-21 ENCOUNTER — Telehealth: Payer: Self-pay | Admitting: Behavioral Health

## 2022-01-21 DIAGNOSIS — F902 Attention-deficit hyperactivity disorder, combined type: Secondary | ICD-10-CM

## 2022-01-21 MED ORDER — AMPHETAMINE-DEXTROAMPHETAMINE 20 MG PO TABS: 20.0000 mg | ORAL_TABLET | Freq: Every day | ORAL | 0 refills | Status: DC

## 2022-01-21 MED ORDER — AMPHETAMINE-DEXTROAMPHETAMINE 30 MG PO TABS: 30.0000 mg | ORAL_TABLET | Freq: Every day | ORAL | 0 refills | Status: DC

## 2022-01-21 NOTE — Telephone Encounter (Signed)
Pt has made an appt for 12/21. He needs refills on both of his adderall and his vraylar.. [facility] [address]

## 2022-01-21 NOTE — Telephone Encounter (Signed)
Rx for Adderall pended. He has RF available on Vraylar at the pharmacy. Left VM with this information.

## 2022-01-22 ENCOUNTER — Other Ambulatory Visit: Payer: Self-pay | Admitting: Adult Health

## 2022-01-24 ENCOUNTER — Other Ambulatory Visit: Payer: Self-pay | Admitting: Behavioral Health

## 2022-01-24 DIAGNOSIS — F331 Major depressive disorder, recurrent, moderate: Secondary | ICD-10-CM

## 2022-01-24 DIAGNOSIS — F319 Bipolar disorder, unspecified: Secondary | ICD-10-CM

## 2022-01-24 DIAGNOSIS — F411 Generalized anxiety disorder: Secondary | ICD-10-CM

## 2022-02-01 ENCOUNTER — Other Ambulatory Visit: Payer: Self-pay | Admitting: Adult Health

## 2022-02-03 ENCOUNTER — Ambulatory Visit (INDEPENDENT_AMBULATORY_CARE_PROVIDER_SITE_OTHER): Payer: BC Managed Care – PPO | Admitting: Behavioral Health

## 2022-02-03 ENCOUNTER — Encounter: Payer: Self-pay | Admitting: Behavioral Health

## 2022-02-03 DIAGNOSIS — F319 Bipolar disorder, unspecified: Secondary | ICD-10-CM | POA: Diagnosis not present

## 2022-02-03 DIAGNOSIS — F5105 Insomnia due to other mental disorder: Secondary | ICD-10-CM | POA: Diagnosis not present

## 2022-02-03 DIAGNOSIS — R5382 Chronic fatigue, unspecified: Secondary | ICD-10-CM

## 2022-02-03 DIAGNOSIS — F411 Generalized anxiety disorder: Secondary | ICD-10-CM

## 2022-02-03 DIAGNOSIS — F902 Attention-deficit hyperactivity disorder, combined type: Secondary | ICD-10-CM | POA: Diagnosis not present

## 2022-02-03 DIAGNOSIS — F99 Mental disorder, not otherwise specified: Secondary | ICD-10-CM

## 2022-02-03 MED ORDER — DIVALPROEX SODIUM 250 MG PO DR TAB: 500.0000 mg | DELAYED_RELEASE_TABLET | Freq: Every evening | ORAL | 1 refills | Status: DC

## 2022-02-03 MED ORDER — CLONIDINE HCL 0.1 MG PO TABS: 0.1000 mg | ORAL_TABLET | Freq: Two times a day (BID) | ORAL | 0 refills | Status: DC

## 2022-02-03 MED ORDER — SERTRALINE HCL 100 MG PO TABS: ORAL_TABLET | ORAL | 0 refills | Status: DC

## 2022-02-03 MED ORDER — QUETIAPINE FUMARATE 100 MG PO TABS: 200.0000 mg | ORAL_TABLET | Freq: Every day | ORAL | 2 refills | Status: DC

## 2022-02-03 MED ORDER — CARIPRAZINE HCL 3 MG PO CAPS: 3.0000 mg | ORAL_CAPSULE | Freq: Every day | ORAL | 1 refills | Status: DC

## 2022-02-03 NOTE — Progress Notes (Signed)
[facility]  Patient ID: Oscar Weber,  MRN: [id number]  PCP: Emelia Loron, NP  Date of Evaluation: 02/03/2022 Time spent:30 minutes  Chief Complaint:  Chief Complaint   Anxiety; Depression; Follow-up; Medication Refill; Medication Problem; Patient Education     HISTORY/CURRENT STATUS: HPI  41 year old male presents to this office for follow up and medication management.  His wife is present with his consent. He is irritable and flat this visit. Says that he was fired from job 3 days ago. He has been depressed and unmotivated. Difficult to get out of bed. He would like to adjust his medication this visit.  He would like to increase his Seroquel to 200 mg at bedtime for sleep.    He reports his anxiety today is 3/10 and depression 2/10. He is sleeping 7-8 hours per night with aid of Seroquel. No mania present, no psychosis. No SI/HI.   Past psychiatric medication trials: Zoloft Prozac Celexa Lexapro Paxil Wellbutrin Remeron Seroquel Geodon Depakote      TIndividual Medical History/ Review of Systems: Changes? :No   Allergies: Bupropion and Gadolinium derivatives  Current Medications:  Current Outpatient Medications:    cariprazine (VRAYLAR) 3 MG capsule, Take 1 capsule (3 mg total) by mouth daily., Disp: 30 capsule, Rfl: 1   albuterol (VENTOLIN HFA) 108 (90 Base) MCG/ACT inhaler, Inhale 1-2 puffs into the lungs every 4 (four) hours as needed for wheezing or shortness of breath., Disp: 6.7 g, Rfl: 0   amphetamine-dextroamphetamine (ADDERALL XR) 30 MG 24 hr capsule, Take 1 capsule (30 mg total) by mouth every morning., Disp: 30 capsule, Rfl: 0   amphetamine-dextroamphetamine (ADDERALL XR) 30 MG 24 hr capsule, TAKE 1 CAPSULE BY MOUTH EVERY DAY, Disp: 30 capsule, Rfl: 0   amphetamine-dextroamphetamine (ADDERALL XR) 30 MG 24 hr capsule, Take 1 capsule (30 mg total) by mouth daily., Disp: 30 capsule, Rfl: 0   amphetamine-dextroamphetamine (ADDERALL) 20 MG  tablet, Take 1 tablet (20 mg) by mouth daily in the afternoon, Disp: 30 tablet, Rfl: 0   amphetamine-dextroamphetamine (ADDERALL) 20 MG tablet, Take 1 tablet (20 mg total) by mouth daily for 30 doses., Disp: 30 tablet, Rfl: 0   amphetamine-dextroamphetamine (ADDERALL) 30 MG tablet, Take 1 tablet by mouth daily., Disp: 30 tablet, Rfl: 0   celecoxib (CELEBREX) 200 MG capsule, Take 200 mg by mouth 2 (two) times daily as needed., Disp: , Rfl:    cloNIDine (CATAPRES) 0.1 MG tablet, TAKE 1 TABLET BY MOUTH 2 TIMES DAILY, Disp: 60 tablet, Rfl: 0   divalproex (DEPAKOTE) 250 MG DR tablet, TAKE 1 TABLET BY MOUTH EVERY DAY, Disp: 90 tablet, Rfl: 0   LORazepam (ATIVAN) 1 MG tablet, TAKE 1 TABLET BY MOUTH 3 TIMES DAILY AS NEEDED FOR ANXIETY., Disp: 270 tablet, Rfl: 0   oxyCODONE-acetaminophen (PERCOCET) 10-325 MG tablet, Take 1 tablet by mouth every 6 (six) hours as needed., Disp: , Rfl:    QUEtiapine (SEROQUEL) 100 MG tablet, Take 1 tablet (100 mg total) by mouth at bedtime., Disp: 90 tablet, Rfl: 1   sertraline (ZOLOFT) 100 MG tablet, TAKE 2 TABLETS BY MOUTH EACH MORNING, Disp: 180 tablet, Rfl: 0   triamcinolone cream (KENALOG) 0.1 %, Apply topically 2 (two) times daily., Disp: , Rfl:  Medication Side Effects: none  Family Medical/ Social History: Changes? No  MENTAL HEALTH EXAM:  There were no vitals taken for this visit.There is no height or weight on file to calculate BMI.  General Appearance: Casual, Neat, and Well Groomed  Eye Contact:  Good  Speech:  Clear and Coherent  Volume:  Normal  Mood:  Depressed, Dysphoric, and Irritable  Affect:  Congruent, Depressed, Flat, and Anxious  Thought Process:  Coherent  Orientation:  Full (Time, Place, and Person)  Thought Content: Logical   Suicidal Thoughts:  No  Homicidal Thoughts:  No  Memory:  WNL  Judgement:  Fair  Insight:  Fair  Psychomotor Activity:  Normal  Concentration:  Concentration: Fair  Recall:  [employer] of Knowledge: Fair   Language: Fair  Assets:  Desire for Improvement  ADL's:  Intact  Cognition: WNL  Prognosis:  Fair    DIAGNOSES:    ICD-10-CM   1. Bipolar depression (HCC)  F31.9 cariprazine (VRAYLAR) 3 MG capsule    2. Generalized anxiety disorder  F41.1     3. Insomnia due to other mental disorder  F51.05    F99     4. Attention deficit hyperactivity disorder (ADHD), combined type  F90.2     5. Chronic fatigue  R53.82       Receiving Psychotherapy: No    RECOMMENDATIONS:   Greater than 50% of  30  min face to face time with patient was spent on counseling and coordination of care.  We discussed his continued irritability and increase in depression. He was recently fired from his job 3 days ago.  Reinforced again and discussed his long term use of benzodiazapine's. He is currently experiencing increased irritability due to unhappiness with work. Increased anxiety and depression. Reinforced potential benefits, risk, and side effects of benzodiazepines to include potential risk of tolerance and dependence, as well as possible drowsiness.  Advised patient not to drive if experiencing drowsiness and to take lowest possible effective dose to minimize risk of dependence and tolerance.  Discussed potential benefits, risks, and side effects of stimulants with patient to include increased heart rate, palpitations, insomnia, increased anxiety, increased irritability, or decreased appetite.  Instructed patient to contact office if experiencing any significant tolerability issues.  Discussed potential metabolic side effects associated with atypical antipsychotics, as well as potential risk for movement side effects. Advised pt to contact office if movement side effects occur.    To increase Vraylar back to 3 mg daily. To continue Adderall 30 mg XR  daily Continue Adderall 20 mg IR daily in afternoon Continue Ativan 1 mg 3 times daily Pt increased Depakote, will take 500 mg daily until next visit.   Continue Gabapentin 300 mg once in the am, 300 mg in the afternoon, and 600 mg at bedtime (total 1200 mg daily). Pt increased Seroquel to 200 mg at bedtime. Continue Zoloft 200 mg daily.  Place labs for Depakote levels next visit. Reviewed PDMP    Joan Flores, NP

## 2022-02-21 ENCOUNTER — Telehealth: Payer: Self-pay | Admitting: Behavioral Health

## 2022-02-21 NOTE — Telephone Encounter (Signed)
Ok to replace XR with IR?

## 2022-02-21 NOTE — Telephone Encounter (Signed)
Next visit is 04/06/22. Requesting refill on Adderall IR for both 20 and 30 mg. [facility] [address] [facility] in [facility]. They don't have XR 30 mg in stock.   [facility]  [id number] [place], [place]    KKXFG:[phone]:[phone]

## 2022-02-21 NOTE — Telephone Encounter (Signed)
If pt is okay with the change and understands it will be different.

## 2022-02-22 ENCOUNTER — Other Ambulatory Visit: Payer: Self-pay

## 2022-02-22 DIAGNOSIS — F902 Attention-deficit hyperactivity disorder, combined type: Secondary | ICD-10-CM

## 2022-02-22 MED ORDER — AMPHETAMINE-DEXTROAMPHETAMINE 20 MG PO TABS: 20.0000 mg | ORAL_TABLET | Freq: Every day | ORAL | 0 refills | Status: DC

## 2022-02-22 MED ORDER — AMPHETAMINE-DEXTROAMPHETAMINE 30 MG PO TABS: 30.0000 mg | ORAL_TABLET | Freq: Every day | ORAL | 0 refills | Status: DC

## 2022-02-22 NOTE — Telephone Encounter (Signed)
Pended.

## 2022-02-25 ENCOUNTER — Other Ambulatory Visit: Payer: Self-pay | Admitting: Behavioral Health

## 2022-02-25 DIAGNOSIS — F902 Attention-deficit hyperactivity disorder, combined type: Secondary | ICD-10-CM

## 2022-02-25 DIAGNOSIS — F411 Generalized anxiety disorder: Secondary | ICD-10-CM

## 2022-03-07 ENCOUNTER — Telehealth: Payer: Self-pay | Admitting: Behavioral Health

## 2022-03-07 NOTE — Telephone Encounter (Signed)
Pt called checking status on early refill. Please advise Pt if you approve. [phone]

## 2022-03-07 NOTE — Telephone Encounter (Signed)
Filled 10/31 due 1/29

## 2022-03-07 NOTE — Telephone Encounter (Signed)
Pt lvm that he needs a refill on his lorazapam 1 mg [facility] [address]

## 2022-03-07 NOTE — Telephone Encounter (Signed)
He wants a 90 day supply sent in

## 2022-03-11 ENCOUNTER — Other Ambulatory Visit: Payer: Self-pay

## 2022-03-11 DIAGNOSIS — F411 Generalized anxiety disorder: Secondary | ICD-10-CM

## 2022-03-11 DIAGNOSIS — F331 Major depressive disorder, recurrent, moderate: Secondary | ICD-10-CM

## 2022-03-11 DIAGNOSIS — F319 Bipolar disorder, unspecified: Secondary | ICD-10-CM

## 2022-03-11 MED ORDER — LORAZEPAM 1 MG PO TABS: 1.0000 mg | ORAL_TABLET | Freq: Three times a day (TID) | ORAL | 0 refills | Status: DC | PRN

## 2022-03-11 NOTE — Telephone Encounter (Signed)
Pended.

## 2022-03-28 ENCOUNTER — Telehealth: Payer: Self-pay | Admitting: Behavioral Health

## 2022-03-28 ENCOUNTER — Other Ambulatory Visit: Payer: Self-pay

## 2022-03-28 DIAGNOSIS — F902 Attention-deficit hyperactivity disorder, combined type: Secondary | ICD-10-CM

## 2022-03-28 MED ORDER — AMPHETAMINE-DEXTROAMPHET ER 30 MG PO CP24: ORAL_CAPSULE | ORAL | 0 refills | Status: DC

## 2022-03-28 MED ORDER — AMPHETAMINE-DEXTROAMPHETAMINE 20 MG PO TABS: 20.0000 mg | ORAL_TABLET | Freq: Every day | ORAL | 0 refills | Status: DC

## 2022-03-28 NOTE — Telephone Encounter (Signed)
Pt LVM @ 12:39p.  He would like refill of Adderall XR 61m and Adderall IR 263msent to    [id number] [place] - [id number], [place]one: [phone]: [phone] It is in stock there. Next appt 2/21

## 2022-03-28 NOTE — Telephone Encounter (Signed)
Pended.

## 2022-04-06 ENCOUNTER — Ambulatory Visit: Payer: BC Managed Care – PPO | Admitting: Behavioral Health

## 2022-04-16 ENCOUNTER — Other Ambulatory Visit: Payer: Self-pay | Admitting: Behavioral Health

## 2022-04-16 DIAGNOSIS — F319 Bipolar disorder, unspecified: Secondary | ICD-10-CM

## 2022-04-27 ENCOUNTER — Telehealth: Payer: Self-pay | Admitting: Behavioral Health

## 2022-04-27 NOTE — Telephone Encounter (Signed)
Pt lvm that he needs his adderall 20 mg and adderall xr 30 mg. [facility] [address]  He has a new job and works from 7:30 am to 4:30 pm and he will call back tomorrow to schedule. He said brian will fill without an appt. Last seen in December 23

## 2022-04-28 ENCOUNTER — Other Ambulatory Visit: Payer: Self-pay | Admitting: Behavioral Health

## 2022-04-28 DIAGNOSIS — F902 Attention-deficit hyperactivity disorder, combined type: Secondary | ICD-10-CM

## 2022-04-28 MED ORDER — AMPHETAMINE-DEXTROAMPHET ER 30 MG PO CP24: ORAL_CAPSULE | ORAL | 0 refills | Status: DC

## 2022-04-28 MED ORDER — AMPHETAMINE-DEXTROAMPHETAMINE 20 MG PO TABS: 20.0000 mg | ORAL_TABLET | Freq: Every day | ORAL | 0 refills | Status: DC

## 2022-04-28 NOTE — Telephone Encounter (Signed)
Pt called at 10:09a.  He has appt for 4/17.  Pls send in scripts.  He [facility], not the one he told Sarah yesterday.  He told me that was a mistake.

## 2022-04-30 ENCOUNTER — Other Ambulatory Visit: Payer: Self-pay | Admitting: Adult Health

## 2022-04-30 DIAGNOSIS — F319 Bipolar disorder, unspecified: Secondary | ICD-10-CM

## 2022-04-30 DIAGNOSIS — F411 Generalized anxiety disorder: Secondary | ICD-10-CM

## 2022-05-01 ENCOUNTER — Other Ambulatory Visit: Payer: Self-pay

## 2022-05-03 ENCOUNTER — Other Ambulatory Visit: Payer: Self-pay | Admitting: Behavioral Health

## 2022-05-03 DIAGNOSIS — F902 Attention-deficit hyperactivity disorder, combined type: Secondary | ICD-10-CM

## 2022-05-03 DIAGNOSIS — F411 Generalized anxiety disorder: Secondary | ICD-10-CM

## 2022-05-03 DIAGNOSIS — F5105 Insomnia due to other mental disorder: Secondary | ICD-10-CM

## 2022-05-03 DIAGNOSIS — F319 Bipolar disorder, unspecified: Secondary | ICD-10-CM

## 2022-05-30 ENCOUNTER — Telehealth: Payer: Self-pay | Admitting: Behavioral Health

## 2022-05-30 ENCOUNTER — Other Ambulatory Visit: Payer: Self-pay | Admitting: Behavioral Health

## 2022-05-30 DIAGNOSIS — F902 Attention-deficit hyperactivity disorder, combined type: Secondary | ICD-10-CM

## 2022-05-30 MED ORDER — AMPHETAMINE-DEXTROAMPHETAMINE 20 MG PO TABS: 20.0000 mg | ORAL_TABLET | Freq: Every day | ORAL | 0 refills | Status: DC

## 2022-05-30 MED ORDER — AMPHETAMINE-DEXTROAMPHET ER 30 MG PO CP24: ORAL_CAPSULE | ORAL | 0 refills | Status: DC

## 2022-05-30 NOTE — Telephone Encounter (Signed)
Pt requesting  Rx generic adderall XR 30 mg & IR 20 mg to [facility]. In stock [address]

## 2022-06-01 ENCOUNTER — Ambulatory Visit: Payer: BC Managed Care – PPO | Admitting: Behavioral Health

## 2022-06-10 ENCOUNTER — Other Ambulatory Visit: Payer: Self-pay | Admitting: Behavioral Health

## 2022-06-10 ENCOUNTER — Ambulatory Visit (INDEPENDENT_AMBULATORY_CARE_PROVIDER_SITE_OTHER): Payer: BC Managed Care – PPO | Admitting: Behavioral Health

## 2022-06-10 ENCOUNTER — Encounter: Payer: Self-pay | Admitting: Behavioral Health

## 2022-06-10 DIAGNOSIS — F331 Major depressive disorder, recurrent, moderate: Secondary | ICD-10-CM | POA: Diagnosis not present

## 2022-06-10 DIAGNOSIS — F411 Generalized anxiety disorder: Secondary | ICD-10-CM | POA: Diagnosis not present

## 2022-06-10 DIAGNOSIS — F319 Bipolar disorder, unspecified: Secondary | ICD-10-CM

## 2022-06-10 DIAGNOSIS — F5105 Insomnia due to other mental disorder: Secondary | ICD-10-CM

## 2022-06-10 MED ORDER — CARIPRAZINE HCL 3 MG PO CAPS
3.0000 mg | ORAL_CAPSULE | Freq: Every day | ORAL | 1 refills | Status: DC
Start: 2022-06-10 — End: 2022-09-26

## 2022-06-10 MED ORDER — DIVALPROEX SODIUM 250 MG PO DR TAB
500.0000 mg | DELAYED_RELEASE_TABLET | Freq: Every evening | ORAL | 1 refills | Status: DC
Start: 2022-06-10 — End: 2022-09-26

## 2022-06-10 MED ORDER — SERTRALINE HCL 100 MG PO TABS
ORAL_TABLET | ORAL | 1 refills | Status: DC
Start: 2022-06-10 — End: 2022-09-26

## 2022-06-10 MED ORDER — LORAZEPAM 1 MG PO TABS: 1.0000 mg | ORAL_TABLET | Freq: Three times a day (TID) | ORAL | 0 refills | Status: DC | PRN

## 2022-06-10 NOTE — Progress Notes (Signed)
[facility]  Patient ID: Oscar Weber,  MRN: [id number]  PCP: Carmel Sacramento, NP  Date of Evaluation: 06/10/2022 Time spent:30 minutes  Chief Complaint:  Chief Complaint   Depression; Anxiety; Follow-up; Medication Problem; Medication Refill       HISTORY/CURRENT STATUS: HPI  42 year old male presents to this office for follow up and medication management.  No changes this visit. Patient would like to continue with current medication regimen. Feeling much more balanced with Vraylar 3 mg.  Says that even though he has some irritability and agitation, he feels much more balanced and controlled than before.  He has new job at [employer] he like so far. Working 40 plus hours per week.  He reports his anxiety today is 3/10 and depression 2/10. He is sleeping 7-8 hours per night with aid of Seroquel. No mania present, no psychosis. No SI/HI.   Past psychiatric medication trials: Zoloft Prozac Celexa Lexapro Paxil Wellbutrin Remeron Seroquel Geodon Depakote       Individual Medical History/ Review of Systems: Changes? :No   Allergies: Bupropion and Gadolinium derivatives  Current Medications:  Current Outpatient Medications:    albuterol (VENTOLIN HFA) 108 (90 Base) MCG/ACT inhaler, Inhale 1-2 puffs into the lungs every 4 (four) hours as needed for wheezing or shortness of breath., Disp: 6.7 g, Rfl: 0   amphetamine-dextroamphetamine (ADDERALL XR) 30 MG 24 hr capsule, Take 1 capsule (30 mg total) by mouth every morning., Disp: 30 capsule, Rfl: 0   amphetamine-dextroamphetamine (ADDERALL XR) 30 MG 24 hr capsule, Take 1 capsule (30 mg total) by mouth daily., Disp: 30 capsule, Rfl: 0   amphetamine-dextroamphetamine (ADDERALL XR) 30 MG 24 hr capsule, TAKE 1 CAPSULE BY MOUTH EVERY DAY, Disp: 30 capsule, Rfl: 0   amphetamine-dextroamphetamine (ADDERALL) 20 MG tablet, Take 1 tablet (20 mg) by mouth daily in the afternoon, Disp: 30 tablet, Rfl: 0    amphetamine-dextroamphetamine (ADDERALL) 20 MG tablet, Take 1 tablet (20 mg total) by mouth daily., Disp: 30 tablet, Rfl: 0   amphetamine-dextroamphetamine (ADDERALL) 30 MG tablet, Take 1 tablet by mouth daily., Disp: 30 tablet, Rfl: 0   cariprazine (VRAYLAR) 3 MG capsule, Take 1 capsule (3 mg total) by mouth daily., Disp: 90 capsule, Rfl: 1   celecoxib (CELEBREX) 200 MG capsule, Take 200 mg by mouth 2 (two) times daily as needed., Disp: , Rfl:    cloNIDine (CATAPRES) 0.1 MG tablet, TAKE 1 TABLET BY MOUTH 2 TIMES DAILY., Disp: 180 tablet, Rfl: 0   divalproex (DEPAKOTE) 250 MG DR tablet, Take 2 tablets (500 mg total) by mouth at bedtime., Disp: 180 tablet, Rfl: 1   LORazepam (ATIVAN) 1 MG tablet, Take 1 tablet (1 mg total) by mouth 3 (three) times daily as needed for anxiety., Disp: 270 tablet, Rfl: 0   oxyCODONE-acetaminophen (PERCOCET) 10-325 MG tablet, Take 1 tablet by mouth every 6 (six) hours as needed., Disp: , Rfl:    QUEtiapine (SEROQUEL) 100 MG tablet, Take 2 tablets (200 mg total) by mouth at bedtime., Disp: 180 tablet, Rfl: 2   sertraline (ZOLOFT) 100 MG tablet, Take two tablets by mouth dialy, Disp: 180 tablet, Rfl: 1   triamcinolone cream (KENALOG) 0.1 %, Apply topically 2 (two) times daily., Disp: , Rfl:  Medication Side Effects: none  Family Medical/ Social History: Changes? No  MENTAL HEALTH EXAM:  There were no vitals taken for this visit.There is no height or weight on file to calculate BMI.  General Appearance: Casual and Neat  Eye  Contact:  Good  Speech:  Clear and Coherent  Volume:  Normal  Mood:  NA  Affect:  Appropriate  Thought Process:  Coherent  Orientation:  Full (Time, Place, and Person)  Thought Content: Logical   Suicidal Thoughts:  No  Homicidal Thoughts:  No  Memory:  WNL  Judgement:  Good  Insight:  Good  Psychomotor Activity:  Normal  Concentration:  Concentration: Good  Recall:  Good  Fund of Knowledge: Good  Language: Good  Assets:  Desire for  Improvement  ADL's:  Intact  Cognition: WNL  Prognosis:  Good    DIAGNOSES:    ICD-10-CM   1. Major depressive disorder, recurrent episode, moderate (HCC)  F33.1 LORazepam (ATIVAN) 1 MG tablet    2. Generalized anxiety disorder  F41.1 LORazepam (ATIVAN) 1 MG tablet    sertraline (ZOLOFT) 100 MG tablet    divalproex (DEPAKOTE) 250 MG DR tablet    3. Bipolar depression (HCC)  F31.9 LORazepam (ATIVAN) 1 MG tablet    cariprazine (VRAYLAR) 3 MG capsule    sertraline (ZOLOFT) 100 MG tablet    divalproex (DEPAKOTE) 250 MG DR tablet      Receiving Psychotherapy: No    RECOMMENDATIONS:  Greater than 50% of  30  min face to face time with patient was spent on counseling and coordination of care. Discussed his current stability. Requesting no changes this visit. Has new job at [employer]. Reinforced again and discussed his long term use of benzodiazapine's. He is currently experiencing increased irritability due to unhappiness with work. Increased anxiety and depression. Reinforced potential benefits, risk, and side effects of benzodiazepines to include potential risk of tolerance and dependence, as well as possible drowsiness.  Advised patient not to drive if experiencing drowsiness and to take lowest possible effective dose to minimize risk of dependence and tolerance.  Discussed potential benefits, risks, and side effects of stimulants with patient to include increased heart rate, palpitations, insomnia, increased anxiety, increased irritability, or decreased appetite.  Instructed patient to contact office if experiencing any significant tolerability issues.  Discussed potential metabolic side effects associated with atypical antipsychotics, as well as potential risk for movement side effects. Advised pt to contact office if movement side effects occur.     To continue Vraylar back to 3 mg daily. To continue Adderall 30 mg XR  daily Continue Adderall 20 mg IR daily in afternoon Continue Ativan 1  mg 3 times daily Pt increased Depakote, will take 500 mg daily until next visit.  Continue Gabapentin 300 mg once in the am, 300 mg in the afternoon, and 600 mg at bedtime (total 1200 mg daily). To continue Seroquel to 200 mg at bedtime. Continue Zoloft 200 mg daily.  Reviewed PDMP      Joan Flores, NP

## 2022-06-23 ENCOUNTER — Telehealth: Payer: Self-pay | Admitting: Behavioral Health

## 2022-06-23 ENCOUNTER — Other Ambulatory Visit: Payer: Self-pay | Admitting: Behavioral Health

## 2022-06-23 NOTE — Telephone Encounter (Signed)
Oscar Weber requesting refills on both of his Adderall Rx's. [id number] [name], [place] - [address] AT [facility] [address] [place] [address] Phone: [phone]  Fax: [phone]   Appointment 10/10/22

## 2022-07-01 ENCOUNTER — Other Ambulatory Visit: Payer: Self-pay | Admitting: Behavioral Health

## 2022-07-01 DIAGNOSIS — F902 Attention-deficit hyperactivity disorder, combined type: Secondary | ICD-10-CM

## 2022-07-01 MED ORDER — AMPHETAMINE-DEXTROAMPHET ER 30 MG PO CP24
ORAL_CAPSULE | ORAL | 0 refills | Status: DC
Start: 2022-07-01 — End: 2022-08-02

## 2022-07-01 MED ORDER — AMPHETAMINE-DEXTROAMPHETAMINE 20 MG PO TABS
20.0000 mg | ORAL_TABLET | Freq: Every day | ORAL | 0 refills | Status: DC
Start: 2022-07-01 — End: 2022-08-02

## 2022-07-01 NOTE — Telephone Encounter (Signed)
Pt called again asking for refills on his adderall xr 30 mg and adderall 20 mg. [facility] [address]

## 2022-07-17 ENCOUNTER — Other Ambulatory Visit: Payer: Self-pay | Admitting: Behavioral Health

## 2022-07-17 DIAGNOSIS — F5105 Insomnia due to other mental disorder: Secondary | ICD-10-CM

## 2022-07-17 DIAGNOSIS — F411 Generalized anxiety disorder: Secondary | ICD-10-CM

## 2022-07-18 ENCOUNTER — Telehealth: Payer: Self-pay | Admitting: Behavioral Health

## 2022-07-18 DIAGNOSIS — F411 Generalized anxiety disorder: Secondary | ICD-10-CM

## 2022-07-18 DIAGNOSIS — F5105 Insomnia due to other mental disorder: Secondary | ICD-10-CM

## 2022-07-18 NOTE — Telephone Encounter (Signed)
Next appt is 10/10/22. Requesting refills on Seroquel and Quetiapine called to:   [id number] [place], [place] - [address]   Phone: [phone]  Fax: [phone]     Please delete the other three pharmacies, he doesn't use them anymore.

## 2022-07-18 NOTE — Telephone Encounter (Signed)
Pt called back and saiod that he wants the generic of seroquel

## 2022-07-18 NOTE — Telephone Encounter (Signed)
Updated pharmacy profile per request.  Called patient because the 2 meds listed are the same thing,one brand name, one generic.

## 2022-07-19 MED ORDER — QUETIAPINE FUMARATE 100 MG PO TABS
200.0000 mg | ORAL_TABLET | Freq: Every day | ORAL | 0 refills | Status: DC
Start: 2022-07-19 — End: 2022-09-16

## 2022-07-19 NOTE — Telephone Encounter (Signed)
Rx sent 

## 2022-08-01 ENCOUNTER — Telehealth: Payer: Self-pay | Admitting: Behavioral Health

## 2022-08-01 NOTE — Telephone Encounter (Signed)
Pt LVM @ 3:37p requesting refill of Adderall XR 30mg  and Adderall 20mg  to   [id number] [place],  - [address] AT [facility] [address] [place] [address] Phone: [phone]  Fax: [phone]   Both are in stock.  Next appt 8/26

## 2022-08-02 ENCOUNTER — Other Ambulatory Visit: Payer: Self-pay | Admitting: Behavioral Health

## 2022-08-02 DIAGNOSIS — F902 Attention-deficit hyperactivity disorder, combined type: Secondary | ICD-10-CM

## 2022-08-02 MED ORDER — AMPHETAMINE-DEXTROAMPHETAMINE 20 MG PO TABS
20.0000 mg | ORAL_TABLET | Freq: Every day | ORAL | 0 refills | Status: DC
Start: 2022-08-02 — End: 2022-11-04

## 2022-08-02 MED ORDER — AMPHETAMINE-DEXTROAMPHET ER 30 MG PO CP24
ORAL_CAPSULE | ORAL | 0 refills | Status: DC
Start: 2022-08-02 — End: 2022-11-04

## 2022-08-24 ENCOUNTER — Other Ambulatory Visit: Payer: Self-pay | Admitting: Behavioral Health

## 2022-08-24 DIAGNOSIS — F5105 Insomnia due to other mental disorder: Secondary | ICD-10-CM

## 2022-08-24 DIAGNOSIS — F411 Generalized anxiety disorder: Secondary | ICD-10-CM

## 2022-08-24 DIAGNOSIS — F902 Attention-deficit hyperactivity disorder, combined type: Secondary | ICD-10-CM

## 2022-09-02 ENCOUNTER — Telehealth: Payer: Self-pay | Admitting: Behavioral Health

## 2022-09-02 ENCOUNTER — Other Ambulatory Visit: Payer: Self-pay

## 2022-09-02 DIAGNOSIS — F902 Attention-deficit hyperactivity disorder, combined type: Secondary | ICD-10-CM

## 2022-09-02 MED ORDER — AMPHETAMINE-DEXTROAMPHETAMINE 20 MG PO TABS: ORAL_TABLET | ORAL | 0 refills | Status: DC

## 2022-09-02 MED ORDER — AMPHETAMINE-DEXTROAMPHET ER 30 MG PO CP24
30.0000 mg | ORAL_CAPSULE | Freq: Every day | ORAL | 0 refills | Status: DC
Start: 2022-09-02 — End: 2022-09-26

## 2022-09-02 NOTE — Telephone Encounter (Signed)
Pended.

## 2022-09-02 NOTE — Telephone Encounter (Signed)
Tam called to request refill of his Adderall prescriptions, 20mg  and 30mg  XR.  Appt 8/26.  [id number] [place], [place] - [address]

## 2022-09-06 ENCOUNTER — Telehealth: Payer: Self-pay | Admitting: Behavioral Health

## 2022-09-06 ENCOUNTER — Other Ambulatory Visit: Payer: Self-pay

## 2022-09-06 DIAGNOSIS — F411 Generalized anxiety disorder: Secondary | ICD-10-CM

## 2022-09-06 DIAGNOSIS — F331 Major depressive disorder, recurrent, moderate: Secondary | ICD-10-CM

## 2022-09-06 DIAGNOSIS — F319 Bipolar disorder, unspecified: Secondary | ICD-10-CM

## 2022-09-06 NOTE — Telephone Encounter (Signed)
Pended.

## 2022-09-06 NOTE — Telephone Encounter (Signed)
Pt called and said that he needs a refill on his lorazapam 1 mg.[facility] [address]

## 2022-09-06 NOTE — Telephone Encounter (Signed)
LF 4/27 x 90 days, due 7/24

## 2022-09-07 MED ORDER — LORAZEPAM 1 MG PO TABS
1.0000 mg | ORAL_TABLET | Freq: Three times a day (TID) | ORAL | 0 refills | Status: DC | PRN
Start: 2022-09-07 — End: 2022-12-02

## 2022-09-15 ENCOUNTER — Other Ambulatory Visit: Payer: Self-pay | Admitting: Behavioral Health

## 2022-09-15 DIAGNOSIS — F5105 Insomnia due to other mental disorder: Secondary | ICD-10-CM

## 2022-09-15 DIAGNOSIS — F411 Generalized anxiety disorder: Secondary | ICD-10-CM

## 2022-09-26 ENCOUNTER — Ambulatory Visit (INDEPENDENT_AMBULATORY_CARE_PROVIDER_SITE_OTHER): Payer: BC Managed Care – PPO | Admitting: Behavioral Health

## 2022-09-26 ENCOUNTER — Encounter: Payer: Self-pay | Admitting: Behavioral Health

## 2022-09-26 DIAGNOSIS — F411 Generalized anxiety disorder: Secondary | ICD-10-CM

## 2022-09-26 DIAGNOSIS — L219 Seborrheic dermatitis, unspecified: Secondary | ICD-10-CM

## 2022-09-26 DIAGNOSIS — F902 Attention-deficit hyperactivity disorder, combined type: Secondary | ICD-10-CM

## 2022-09-26 DIAGNOSIS — F99 Mental disorder, not otherwise specified: Secondary | ICD-10-CM

## 2022-09-26 DIAGNOSIS — F5105 Insomnia due to other mental disorder: Secondary | ICD-10-CM

## 2022-09-26 DIAGNOSIS — F319 Bipolar disorder, unspecified: Secondary | ICD-10-CM

## 2022-09-26 MED ORDER — AMPHETAMINE-DEXTROAMPHETAMINE 20 MG PO TABS
ORAL_TABLET | ORAL | 0 refills | Status: DC
Start: 2022-10-02 — End: 2022-12-02

## 2022-09-26 MED ORDER — SERTRALINE HCL 100 MG PO TABS
ORAL_TABLET | ORAL | 1 refills | Status: DC
Start: 2022-09-26 — End: 2022-12-16

## 2022-09-26 MED ORDER — DIVALPROEX SODIUM 250 MG PO DR TAB
500.0000 mg | DELAYED_RELEASE_TABLET | Freq: Every evening | ORAL | 1 refills | Status: AC
Start: 2022-09-26 — End: ?

## 2022-09-26 MED ORDER — KETOCONAZOLE 2 % EX CREA
1.0000 | TOPICAL_CREAM | Freq: Every day | CUTANEOUS | 0 refills | Status: AC
Start: 2022-09-26 — End: ?

## 2022-09-26 MED ORDER — CARIPRAZINE HCL 3 MG PO CAPS: 3.0000 mg | ORAL_CAPSULE | Freq: Every day | ORAL | 1 refills | Status: DC

## 2022-09-26 MED ORDER — QUETIAPINE FUMARATE 100 MG PO TABS
200.0000 mg | ORAL_TABLET | Freq: Every day | ORAL | 1 refills | Status: DC
Start: 2022-09-26 — End: 2022-12-16

## 2022-09-26 MED ORDER — AMPHETAMINE-DEXTROAMPHET ER 30 MG PO CP24
30.0000 mg | ORAL_CAPSULE | Freq: Every day | ORAL | 0 refills | Status: DC
Start: 2022-10-02 — End: 2022-12-02

## 2022-09-26 NOTE — Progress Notes (Signed)
[facility]  Patient ID: PERRYN BEYMER,  MRN: [id number]  PCP: Carmel Sacramento, NP  Date of Evaluation: 09/26/2022 Time spent:30 minutes  Chief Complaint:  Chief Complaint   Anxiety; Depression; Follow-up; Medication Refill; Patient Education     HISTORY/CURRENT STATUS: HPI  42 year old male presents to this office for follow up and medication management.  No changes this visit. Patient would like to continue with current medication regimen. Feeling much more balanced with Vraylar 3 mg.  Says that even though he has some irritability and agitation, he feels much more balanced and controlled than before.  He has new job at [employer] he like so far. Working 40 plus hours per week.  He reports his anxiety today is 3/10 and depression 2/10. He is sleeping 7-8 hours per night with aid of Seroquel. No mania present, no psychosis. No SI/HI.   Past psychiatric medication trials: Zoloft Prozac Celexa Lexapro Paxil Wellbutrin Remeron Seroquel Geodon Depakote      Individual Medical History/ Review of Systems: Changes? :No   Allergies: Bupropion and Gadolinium derivatives  Current Medications:  Current Outpatient Medications:    ketoconazole (NIZORAL) 2 % cream, Apply 1 Application topically daily., Disp: 15 g, Rfl: 0   albuterol (VENTOLIN HFA) 108 (90 Base) MCG/ACT inhaler, Inhale 1-2 puffs into the lungs every 4 (four) hours as needed for wheezing or shortness of breath., Disp: 6.7 g, Rfl: 0   amphetamine-dextroamphetamine (ADDERALL XR) 30 MG 24 hr capsule, Take 1 capsule (30 mg total) by mouth every morning., Disp: 30 capsule, Rfl: 0   amphetamine-dextroamphetamine (ADDERALL XR) 30 MG 24 hr capsule, TAKE 1 CAPSULE BY MOUTH EVERY DAY, Disp: 30 capsule, Rfl: 0   [START ON 10/02/2022] amphetamine-dextroamphetamine (ADDERALL XR) 30 MG 24 hr capsule, Take 1 capsule (30 mg total) by mouth daily., Disp: 30 capsule, Rfl: 0   amphetamine-dextroamphetamine (ADDERALL) 20 MG tablet,  Take 1 tablet (20 mg total) by mouth daily., Disp: 30 tablet, Rfl: 0   [START ON 10/02/2022] amphetamine-dextroamphetamine (ADDERALL) 20 MG tablet, Take 1 tablet (20 mg) by mouth daily in the afternoon, Disp: 30 tablet, Rfl: 0   amphetamine-dextroamphetamine (ADDERALL) 30 MG tablet, Take 1 tablet by mouth daily., Disp: 30 tablet, Rfl: 0   cariprazine (VRAYLAR) 3 MG capsule, Take 1 capsule (3 mg total) by mouth daily., Disp: 90 capsule, Rfl: 1   celecoxib (CELEBREX) 200 MG capsule, Take 200 mg by mouth 2 (two) times daily as needed., Disp: , Rfl:    cloNIDine (CATAPRES) 0.1 MG tablet, TAKE 1 TABLET BY MOUTH TWICE A DAY, Disp: 180 tablet, Rfl: 0   divalproex (DEPAKOTE) 250 MG DR tablet, Take 2 tablets (500 mg total) by mouth at bedtime., Disp: 180 tablet, Rfl: 1   divalproex (DEPAKOTE) 500 MG DR tablet, TAKE 1 TABLET BY MOUTH AT BEDTIME., Disp: 90 tablet, Rfl: 0   LORazepam (ATIVAN) 1 MG tablet, Take 1 tablet (1 mg total) by mouth 3 (three) times daily as needed for anxiety., Disp: 270 tablet, Rfl: 0   oxyCODONE-acetaminophen (PERCOCET) 10-325 MG tablet, Take 1 tablet by mouth every 6 (six) hours as needed., Disp: , Rfl:    QUEtiapine (SEROQUEL) 100 MG tablet, Take 2 tablets (200 mg total) by mouth at bedtime., Disp: 180 tablet, Rfl: 1   sertraline (ZOLOFT) 100 MG tablet, Take two tablets by mouth dialy, Disp: 180 tablet, Rfl: 1   triamcinolone cream (KENALOG) 0.1 %, Apply topically 2 (two) times daily., Disp: , Rfl:  Medication Side  Effects: none  Family Medical/ Social History: Changes? No  MENTAL HEALTH EXAM:  There were no vitals taken for this visit.There is no height or weight on file to calculate BMI.  General Appearance: Casual, Neat, and Well Groomed  Eye Contact:  Good  Speech:  Clear and Coherent  Volume:  Normal  Mood:  NA  Affect:  Appropriate  Thought Process:  Coherent  Orientation:  Full (Time, Place, and Person)  Thought Content: Logical   Suicidal Thoughts:  No  Homicidal  Thoughts:  No  Memory:  WNL  Judgement:  Good  Insight:  Good  Psychomotor Activity:  Normal  Concentration:  Concentration: Good  Recall:  Good  Fund of Knowledge: Good  Language: Good  Assets:  Desire for Improvement  ADL's:  Intact  Cognition: WNL  Prognosis:  Good    DIAGNOSES:    ICD-10-CM   1. Seborrheic dermatitis  L21.9 ketoconazole (NIZORAL) 2 % cream    2. Bipolar depression (HCC)  F31.9 cariprazine (VRAYLAR) 3 MG capsule    divalproex (DEPAKOTE) 250 MG DR tablet    sertraline (ZOLOFT) 100 MG tablet    3. Generalized anxiety disorder  F41.1 divalproex (DEPAKOTE) 250 MG DR tablet    QUEtiapine (SEROQUEL) 100 MG tablet    sertraline (ZOLOFT) 100 MG tablet    4. Insomnia due to other mental disorder  F51.05 QUEtiapine (SEROQUEL) 100 MG tablet   F99     5. Attention deficit hyperactivity disorder (ADHD), combined type  F90.2 amphetamine-dextroamphetamine (ADDERALL XR) 30 MG 24 hr capsule    amphetamine-dextroamphetamine (ADDERALL) 20 MG tablet      Receiving Psychotherapy: No    RECOMMENDATIONS:   Greater than 50% of  30  min face to face time with patient was spent on counseling and coordination of care. Discussed his current stability. Requesting no changes this visit. Has new job at [employer]. Pt has some diagnosed Seborrheic Dermatitis on face on left side of nose. Cannot get in with PCP. Says face is burning. Requesting I bridge him with Nizoral cream until he can get back in with PCP. Noted in chart prior RX for condition.  Reinforced again and discussed his long term use of benzodiazapine's. He is currently experiencing increased irritability due to unhappiness with work. Increased anxiety and depression. Reinforced potential benefits, risk, and side effects of benzodiazepines to include potential risk of tolerance and dependence, as well as possible drowsiness.  Advised patient not to drive if experiencing drowsiness and to take lowest possible effective dose to  minimize risk of dependence and tolerance.  Discussed potential benefits, risks, and side effects of stimulants with patient to include increased heart rate, palpitations, insomnia, increased anxiety, increased irritability, or decreased appetite.  Instructed patient to contact office if experiencing any significant tolerability issues.  Discussed potential metabolic side effects associated with atypical antipsychotics, as well as potential risk for movement side effects. Advised pt to contact office if movement side effects occur.     To continue Vraylar back to 3 mg daily. To continue Adderall 30 mg XR  daily Continue Adderall 20 mg IR daily in afternoon Continue Ativan 1 mg 3 times daily Pt increased Depakote, will take 500 mg daily until next visit.  Continue Gabapentin 300 mg once in the am, 300 mg in the afternoon, and 600 mg at bedtime (total 1200 mg daily). To continue Seroquel to 200 mg at bedtime. Continue Zoloft 200 mg daily.  Reviewed PDMP  Joan Flores, NP

## 2022-10-10 ENCOUNTER — Ambulatory Visit: Payer: BC Managed Care – PPO | Admitting: Behavioral Health

## 2022-10-14 ENCOUNTER — Other Ambulatory Visit: Payer: Self-pay | Admitting: Behavioral Health

## 2022-10-14 DIAGNOSIS — F411 Generalized anxiety disorder: Secondary | ICD-10-CM

## 2022-10-14 DIAGNOSIS — F99 Mental disorder, not otherwise specified: Secondary | ICD-10-CM

## 2022-11-03 ENCOUNTER — Telehealth: Payer: Self-pay | Admitting: Behavioral Health

## 2022-11-03 NOTE — Telephone Encounter (Signed)
Pt LVM @ 10:01a, but message wasn't complete.  I finally reached him again at 5:02p. He is requesting refill of Adderall XR 30mg .  He is also requesting Adderall IR.  He wants an increase from 20mg  to 40mg  (requiring 60pills).  He said he needs it for work.  Arlys John can call him to discuss, if needed.  [id number] [name], [place] - [address] AT [facility] [address] [place] [address] Phone: [phone]  Fax: [phone]   Next appt 11/12

## 2022-11-03 NOTE — Telephone Encounter (Signed)
Oscar Weber usually does not prescribe more than 60 mg total of any combination of stimulants. Will ask him and pend as appropriate.

## 2022-11-04 ENCOUNTER — Other Ambulatory Visit: Payer: Self-pay

## 2022-11-04 DIAGNOSIS — F902 Attention-deficit hyperactivity disorder, combined type: Secondary | ICD-10-CM

## 2022-11-04 MED ORDER — AMPHETAMINE-DEXTROAMPHETAMINE 20 MG PO TABS
20.0000 mg | ORAL_TABLET | Freq: Every day | ORAL | 0 refills | Status: DC
Start: 2022-11-04 — End: 2023-01-31

## 2022-11-04 MED ORDER — AMPHETAMINE-DEXTROAMPHET ER 30 MG PO CP24
ORAL_CAPSULE | ORAL | 0 refills | Status: DC
Start: 2022-11-04 — End: 2023-01-31

## 2022-11-04 NOTE — Telephone Encounter (Signed)
Pended.

## 2022-11-04 NOTE — Telephone Encounter (Signed)
Patient is taking Adderall 30 XR and 20 mg IR. He is asking to increase to 40 mg IR. I know you don't usually prescribe more than 60 mg of any combination of a stimulant, but wanted to run this by you first just to make sure.

## 2022-11-04 NOTE — Telephone Encounter (Signed)
LVM with info that Oscar Weber could not prescribe the requested dose. Will pend his current dose since he is due for a RF.

## 2022-11-25 ENCOUNTER — Other Ambulatory Visit: Payer: Self-pay | Admitting: Behavioral Health

## 2022-11-25 ENCOUNTER — Telehealth: Payer: Self-pay | Admitting: Behavioral Health

## 2022-11-25 DIAGNOSIS — F411 Generalized anxiety disorder: Secondary | ICD-10-CM

## 2022-11-25 DIAGNOSIS — F5105 Insomnia due to other mental disorder: Secondary | ICD-10-CM

## 2022-11-25 MED ORDER — DIVALPROEX SODIUM 500 MG PO DR TAB
1000.0000 mg | DELAYED_RELEASE_TABLET | Freq: Every evening | ORAL | 3 refills | Status: DC
Start: 2022-11-25 — End: 2023-02-20

## 2022-11-25 NOTE — Telephone Encounter (Signed)
Also since Andreus is at work, please Engineer, drilling with any questions.  [phone]

## 2022-11-25 NOTE — Telephone Encounter (Signed)
Rtc to [facility] but unable to reach, left her a voicemail to call me back to discuss his symptoms

## 2022-11-25 NOTE — Telephone Encounter (Signed)
Wife, Hospital doctor, called at [employer] request to report that the change in depakote to 1/day is not working.  It is not helping at all. Even Amber has notice the change in him.  He is requesting that he be able to go back to the previous dose of 3/day.  [id number] [place], [place] - [address]   Appt 16/1

## 2022-11-25 NOTE — Telephone Encounter (Signed)
I am familiar with his changes. We tried reducing Depakote in the past. I sent new RX and advise them that if he was taking 500 mg at bedtime that I would like him to increase back to two 500 mg tablets at bedtime (1000 mg total).

## 2022-11-30 ENCOUNTER — Telehealth: Payer: Self-pay | Admitting: Behavioral Health

## 2022-11-30 NOTE — Telephone Encounter (Signed)
He has RF available for quetiapine. Adderall last filled 9/20, due 10/18.

## 2022-11-30 NOTE — Telephone Encounter (Signed)
Pt lvm that he he needed refills on his quetiapine 100 mg and his adderall xr 30 mg and adderall 20 mg.[facility]

## 2022-12-01 NOTE — Telephone Encounter (Signed)
Patient called into regarding previous message. He needs a written prescription for Quetiapine 100mg   and Lorazepam 1mg . Ph: [phone] Appt 11/[facility] [address] [place], [place]

## 2022-12-01 NOTE — Telephone Encounter (Signed)
Lorazepam not due until 10/20. Has RF of quetiapine.  Message says he wants written Rx, but then says to send to pharmacy. LVM to [facility] to verify what he needs.

## 2022-12-02 ENCOUNTER — Other Ambulatory Visit: Payer: Self-pay

## 2022-12-02 DIAGNOSIS — F411 Generalized anxiety disorder: Secondary | ICD-10-CM

## 2022-12-02 DIAGNOSIS — F902 Attention-deficit hyperactivity disorder, combined type: Secondary | ICD-10-CM

## 2022-12-02 DIAGNOSIS — F319 Bipolar disorder, unspecified: Secondary | ICD-10-CM

## 2022-12-02 DIAGNOSIS — F331 Major depressive disorder, recurrent, moderate: Secondary | ICD-10-CM

## 2022-12-02 MED ORDER — LORAZEPAM 1 MG PO TABS
1.0000 mg | ORAL_TABLET | Freq: Three times a day (TID) | ORAL | 0 refills | Status: DC | PRN
Start: 2022-12-04 — End: 2022-12-16

## 2022-12-02 MED ORDER — AMPHETAMINE-DEXTROAMPHET ER 30 MG PO CP24
30.0000 mg | ORAL_CAPSULE | Freq: Every day | ORAL | 0 refills | Status: DC
Start: 2022-12-02 — End: 2022-12-16

## 2022-12-02 MED ORDER — AMPHETAMINE-DEXTROAMPHETAMINE 20 MG PO TABS
ORAL_TABLET | ORAL | 0 refills | Status: DC
Start: 2022-12-02 — End: 2022-12-16

## 2022-12-02 NOTE — Telephone Encounter (Signed)
LVM on both phones to return call.

## 2022-12-02 NOTE — Telephone Encounter (Signed)
R- can you try to reach pt's wife again just to confirm the dose and also make sure they know Arlys John sent in a new Rx please.

## 2022-12-02 NOTE — Telephone Encounter (Signed)
Noted thank you

## 2022-12-02 NOTE — Telephone Encounter (Signed)
Pended.

## 2022-12-02 NOTE — Telephone Encounter (Signed)
Oscar Weber returned our call.  He said he wants them sent to the pharmacy [id number] - , [place] - [address]   Appt 16/1.  I told him he couldn't pick up the Lorazepam until Sunday.

## 2022-12-14 ENCOUNTER — Other Ambulatory Visit: Payer: Self-pay | Admitting: Behavioral Health

## 2022-12-15 NOTE — Telephone Encounter (Signed)
Has appt. tomorrow

## 2022-12-16 ENCOUNTER — Ambulatory Visit (INDEPENDENT_AMBULATORY_CARE_PROVIDER_SITE_OTHER): Payer: Self-pay | Admitting: Behavioral Health

## 2022-12-16 ENCOUNTER — Encounter: Payer: Self-pay | Admitting: Behavioral Health

## 2022-12-16 DIAGNOSIS — F99 Mental disorder, not otherwise specified: Secondary | ICD-10-CM

## 2022-12-16 DIAGNOSIS — Z79899 Other long term (current) drug therapy: Secondary | ICD-10-CM

## 2022-12-16 DIAGNOSIS — F5105 Insomnia due to other mental disorder: Secondary | ICD-10-CM

## 2022-12-16 DIAGNOSIS — F319 Bipolar disorder, unspecified: Secondary | ICD-10-CM

## 2022-12-16 DIAGNOSIS — F411 Generalized anxiety disorder: Secondary | ICD-10-CM

## 2022-12-16 DIAGNOSIS — F902 Attention-deficit hyperactivity disorder, combined type: Secondary | ICD-10-CM

## 2022-12-16 MED ORDER — QUETIAPINE FUMARATE 100 MG PO TABS: 200.0000 mg | ORAL_TABLET | Freq: Every day | ORAL | 1 refills | Status: DC

## 2022-12-16 MED ORDER — CARIPRAZINE HCL 3 MG PO CAPS: 3.0000 mg | ORAL_CAPSULE | Freq: Every day | ORAL | 1 refills | Status: DC

## 2022-12-16 MED ORDER — AMPHETAMINE-DEXTROAMPHET ER 30 MG PO CP24: 30.0000 mg | ORAL_CAPSULE | Freq: Every day | ORAL | 0 refills | Status: DC

## 2022-12-16 MED ORDER — DIVALPROEX SODIUM 500 MG PO DR TAB
1500.0000 mg | DELAYED_RELEASE_TABLET | Freq: Three times a day (TID) | ORAL | 1 refills | Status: DC
Start: 2022-12-16 — End: 2023-03-20

## 2022-12-16 MED ORDER — SERTRALINE HCL 100 MG PO TABS: ORAL_TABLET | ORAL | 1 refills | Status: DC

## 2022-12-16 MED ORDER — CLONIDINE HCL 0.1 MG PO TABS: 0.1000 mg | ORAL_TABLET | Freq: Two times a day (BID) | ORAL | 1 refills | Status: DC

## 2022-12-16 MED ORDER — LORAZEPAM 1 MG PO TABS: 1.0000 mg | ORAL_TABLET | Freq: Three times a day (TID) | ORAL | 0 refills | Status: DC | PRN

## 2022-12-16 MED ORDER — AMPHETAMINE-DEXTROAMPHETAMINE 20 MG PO TABS: ORAL_TABLET | ORAL | 0 refills | Status: DC

## 2022-12-16 NOTE — Progress Notes (Signed)
[facility]  Patient ID: Oscar Weber,  MRN: [id number]  PCP: Carmel Sacramento, NP  Date of Evaluation: 12/16/2022 Time spent:30 minutes  Chief Complaint:  Chief Complaint   Anxiety; Depression; Follow-up; Patient Education; Medication Refill; Stress     HISTORY/CURRENT STATUS: HPI 42 year old male presents to this office for follow up and medication management.  No changes this visit. Patient would like to continue with current medication regimen. Feeling much more balanced with Vraylar 3 mg.  His wife was noticing some early signs that he was not doing as well recently and he went up on his Depakote to 1500 mg daily.  Says he can tell there is a positive improvement. He has new job at [employer] he like so far. Working 40 plus hours per week.  He reports his anxiety today is 4/10 and depression 3/10. He is sleeping 7-8 hours per night with aid of Seroquel. No mania present, no psychosis. No SI/HI.   Past psychiatric medication trials: Zoloft Prozac Celexa Lexapro Paxil Wellbutrin Remeron Seroquel Geodon Depakote     Individual Medical History/ Review of Systems: Changes? :No   Allergies: Bupropion and Gadolinium derivatives  Current Medications:  Current Outpatient Medications:    divalproex (DEPAKOTE) 500 MG DR tablet, Take 3 tablets (1,500 mg total) by mouth 3 (three) times daily., Disp: 90 tablet, Rfl: 1   albuterol (VENTOLIN HFA) 108 (90 Base) MCG/ACT inhaler, Inhale 1-2 puffs into the lungs every 4 (four) hours as needed for wheezing or shortness of breath., Disp: 6.7 g, Rfl: 0   amphetamine-dextroamphetamine (ADDERALL XR) 30 MG 24 hr capsule, Take 1 capsule (30 mg total) by mouth every morning., Disp: 30 capsule, Rfl: 0   amphetamine-dextroamphetamine (ADDERALL XR) 30 MG 24 hr capsule, TAKE 1 CAPSULE BY MOUTH EVERY DAY, Disp: 30 capsule, Rfl: 0   [START ON 01/01/2023] amphetamine-dextroamphetamine (ADDERALL XR) 30 MG 24 hr capsule, Take 1 capsule (30 mg  total) by mouth daily., Disp: 30 capsule, Rfl: 0   amphetamine-dextroamphetamine (ADDERALL) 20 MG tablet, Take 1 tablet (20 mg total) by mouth daily., Disp: 30 tablet, Rfl: 0   [START ON 01/01/2023] amphetamine-dextroamphetamine (ADDERALL) 20 MG tablet, Take 1 tablet (20 mg) by mouth daily in the afternoon, Disp: 30 tablet, Rfl: 0   cariprazine (VRAYLAR) 3 MG capsule, Take 1 capsule (3 mg total) by mouth daily., Disp: 90 capsule, Rfl: 1   celecoxib (CELEBREX) 200 MG capsule, Take 200 mg by mouth 2 (two) times daily as needed., Disp: , Rfl:    cloNIDine (CATAPRES) 0.1 MG tablet, Take 1 tablet (0.1 mg total) by mouth 2 (two) times daily., Disp: 180 tablet, Rfl: 1   divalproex (DEPAKOTE) 250 MG DR tablet, Take 2 tablets (500 mg total) by mouth at bedtime., Disp: 180 tablet, Rfl: 1   divalproex (DEPAKOTE) 500 MG DR tablet, Take 2 tablets (1,000 mg total) by mouth at bedtime., Disp: 60 tablet, Rfl: 3   ketoconazole (NIZORAL) 2 % cream, Apply 1 Application topically daily., Disp: 15 g, Rfl: 0   LORazepam (ATIVAN) 1 MG tablet, Take 1 tablet (1 mg total) by mouth 3 (three) times daily as needed for anxiety., Disp: 270 tablet, Rfl: 0   oxyCODONE-acetaminophen (PERCOCET) 10-325 MG tablet, Take 1 tablet by mouth every 6 (six) hours as needed., Disp: , Rfl:    QUEtiapine (SEROQUEL) 100 MG tablet, Take 2 tablets (200 mg total) by mouth at bedtime., Disp: 180 tablet, Rfl: 1   sertraline (ZOLOFT) 100 MG tablet, Take  two tablets by mouth dialy, Disp: 180 tablet, Rfl: 1   triamcinolone cream (KENALOG) 0.1 %, Apply topically 2 (two) times daily., Disp: , Rfl:  Medication Side Effects: none  Family Medical/ Social History: Changes? No  MENTAL HEALTH EXAM:  There were no vitals taken for this visit.There is no height or weight on file to calculate BMI.  General Appearance: Casual, Neat, and Well Groomed  Eye Contact:  Good  Speech:  Clear and Coherent  Volume:  Normal  Mood:  Anxious, Depressed, and Dysphoric   Affect:  Depressed, Flat, and Anxious  Thought Process:  Coherent  Orientation:  Full (Time, Place, and Person)  Thought Content: Logical   Suicidal Thoughts:  No  Homicidal Thoughts:  No  Memory:  WNL  Judgement:  Good  Insight:  Good  Psychomotor Activity:  Normal  Concentration:  Concentration: Good  Recall:  Good  Fund of Knowledge: Good  Language: Good  Assets:  Desire for Improvement  ADL's:  Intact  Cognition: WNL  Prognosis:  Good    DIAGNOSES:    ICD-10-CM   1. Bipolar depression (HCC)  F31.9 sertraline (ZOLOFT) 100 MG tablet    LORazepam (ATIVAN) 1 MG tablet    divalproex (DEPAKOTE) 500 MG DR tablet    cariprazine (VRAYLAR) 3 MG capsule    2. Generalized anxiety disorder  F41.1 sertraline (ZOLOFT) 100 MG tablet    QUEtiapine (SEROQUEL) 100 MG tablet    LORazepam (ATIVAN) 1 MG tablet    cloNIDine (CATAPRES) 0.1 MG tablet    3. Insomnia due to other mental disorder  F51.05 QUEtiapine (SEROQUEL) 100 MG tablet   F99     4. Attention deficit hyperactivity disorder (ADHD), combined type  F90.2 cloNIDine (CATAPRES) 0.1 MG tablet    amphetamine-dextroamphetamine (ADDERALL XR) 30 MG 24 hr capsule    amphetamine-dextroamphetamine (ADDERALL) 20 MG tablet    5. High risk medication use  Z79.899 Valproic acid level      Receiving Psychotherapy: No    RECOMMENDATIONS:   Greater than 50% of  30  min face to face time with patient was spent on counseling and coordination of care. Discussed his current stability. Requesting no changes this visit.  Dermatitis has cleared on face . Reinforced again and discussed his long term use of benzodiazapine's. Reinforced potential benefits, risk, and side effects of benzodiazepines to include potential risk of tolerance and dependence, as well as possible drowsiness.  Advised patient not to drive if experiencing drowsiness and to take lowest possible effective dose to minimize risk of dependence and tolerance.  Discussed potential  benefits, risks, and side effects of stimulants with patient to include increased heart rate, palpitations, insomnia, increased anxiety, increased irritability, or decreased appetite.  Instructed patient to contact office if experiencing any significant tolerability issues.  Discussed potential metabolic side effects associated with atypical antipsychotics, as well as potential risk for movement side effects. Advised pt to contact office if movement side effects occur.     To continue Vraylar back to 3 mg daily. To continue Adderall 30 mg XR  daily Continue Adderall 20 mg IR daily in afternoon Continue Ativan 1 mg 3 times daily Pt increased Depakote, will continue 1500 mg daily Order Depakote Levels to [facility]. Instructed patient to go within next week.  To continue Seroquel to 200 mg at bedtime. Continue Zoloft 200 mg daily.  Reviewed PDMP          Joan Flores, NP

## 2022-12-27 ENCOUNTER — Ambulatory Visit: Payer: BC Managed Care – PPO | Admitting: Behavioral Health

## 2023-01-18 ENCOUNTER — Other Ambulatory Visit: Payer: Self-pay | Admitting: Behavioral Health

## 2023-01-18 DIAGNOSIS — F5105 Insomnia due to other mental disorder: Secondary | ICD-10-CM

## 2023-01-18 DIAGNOSIS — F411 Generalized anxiety disorder: Secondary | ICD-10-CM

## 2023-01-30 ENCOUNTER — Telehealth: Payer: Self-pay | Admitting: Behavioral Health

## 2023-01-30 DIAGNOSIS — F411 Generalized anxiety disorder: Secondary | ICD-10-CM

## 2023-01-30 DIAGNOSIS — F5105 Insomnia due to other mental disorder: Secondary | ICD-10-CM

## 2023-01-30 MED ORDER — QUETIAPINE FUMARATE 100 MG PO TABS: 200.0000 mg | ORAL_TABLET | Freq: Every day | ORAL | 0 refills | Status: DC

## 2023-01-30 NOTE — Telephone Encounter (Signed)
PT called in to request RF on both Adderall doses  Add 20mg  and AddXR 30mg . [facility] Also, he says he needs a RF on Seroquel. [facility] from the 11/1 RX. He is not sure who his mail order is for the 90 day so he just wants another 30 day supply sent in. [id number] [place], [place] - [address]

## 2023-01-30 NOTE — Telephone Encounter (Signed)
01/02/2023 12/16/2022 1  Dextroamp-Amphetamin 20 Mg Tab 30.00 30 Br Whi [id number] Nor ([id number]) 0/0  Comm Ins [place] 01/02/2023 12/16/2022 1  Lorazepam 1 Mg Tablet 270.00 90 Br Whi [id number] Nor ([id number]) 0/0 3.00 LME Private Pay [place] 01/02/2023 12/16/2022 1  Dextroamp-Amphet Er 30 Mg Cap 30.00

## 2023-01-30 NOTE — Telephone Encounter (Signed)
Sent 30-day RF of Seroquel 200 mg. [facility]

## 2023-01-31 ENCOUNTER — Other Ambulatory Visit: Payer: Self-pay

## 2023-01-31 DIAGNOSIS — F902 Attention-deficit hyperactivity disorder, combined type: Secondary | ICD-10-CM

## 2023-01-31 MED ORDER — AMPHETAMINE-DEXTROAMPHETAMINE 20 MG PO TABS: 20.0000 mg | ORAL_TABLET | Freq: Every day | ORAL | 0 refills | Status: DC

## 2023-01-31 MED ORDER — AMPHETAMINE-DEXTROAMPHET ER 30 MG PO CP24: ORAL_CAPSULE | ORAL | 0 refills | Status: DC

## 2023-01-31 NOTE — Telephone Encounter (Signed)
Pended Adderall 30 XR and [facility] IR to [facility]

## 2023-02-20 ENCOUNTER — Other Ambulatory Visit: Payer: Self-pay | Admitting: Behavioral Health

## 2023-02-20 DIAGNOSIS — F411 Generalized anxiety disorder: Secondary | ICD-10-CM

## 2023-02-20 DIAGNOSIS — F5105 Insomnia due to other mental disorder: Secondary | ICD-10-CM

## 2023-02-23 ENCOUNTER — Telehealth: Payer: Self-pay | Admitting: Behavioral Health

## 2023-02-23 NOTE — Telephone Encounter (Signed)
 Pt LVM @ 12:36p requesting refill of Adderall XR 30mg  and Adderall 20mg  to   [id number] [place], [place] - [address] [facility] [address] [place], [place] [address] Phone: [phone]  Fax: [phone]

## 2023-02-23 NOTE — Telephone Encounter (Signed)
 LF 12/17, due 1/14.

## 2023-02-28 ENCOUNTER — Other Ambulatory Visit: Payer: Self-pay

## 2023-02-28 DIAGNOSIS — F902 Attention-deficit hyperactivity disorder, combined type: Secondary | ICD-10-CM

## 2023-02-28 MED ORDER — AMPHETAMINE-DEXTROAMPHETAMINE 20 MG PO TABS: ORAL_TABLET | ORAL | 0 refills | Status: DC

## 2023-02-28 MED ORDER — AMPHETAMINE-DEXTROAMPHET ER 30 MG PO CP24: 30.0000 mg | ORAL_CAPSULE | Freq: Every day | ORAL | 0 refills | Status: DC

## 2023-02-28 NOTE — Telephone Encounter (Signed)
 Pended Adderall 30 XR and 20 IR to [facility] [address].

## 2023-03-15 ENCOUNTER — Other Ambulatory Visit: Payer: Self-pay | Admitting: Behavioral Health

## 2023-03-15 DIAGNOSIS — F5105 Insomnia due to other mental disorder: Secondary | ICD-10-CM

## 2023-03-15 DIAGNOSIS — F411 Generalized anxiety disorder: Secondary | ICD-10-CM

## 2023-03-17 ENCOUNTER — Other Ambulatory Visit: Payer: Self-pay | Admitting: Behavioral Health

## 2023-03-17 DIAGNOSIS — F319 Bipolar disorder, unspecified: Secondary | ICD-10-CM

## 2023-03-20 ENCOUNTER — Other Ambulatory Visit: Payer: Self-pay

## 2023-03-20 DIAGNOSIS — F319 Bipolar disorder, unspecified: Secondary | ICD-10-CM

## 2023-03-20 MED ORDER — DIVALPROEX SODIUM 500 MG PO DR TAB: 1500.0000 mg | DELAYED_RELEASE_TABLET | Freq: Every day | ORAL | 1 refills | Status: AC

## 2023-03-24 ENCOUNTER — Encounter: Payer: Self-pay | Admitting: Behavioral Health

## 2023-03-24 ENCOUNTER — Ambulatory Visit: Payer: 59 | Admitting: Behavioral Health

## 2023-03-24 DIAGNOSIS — F5105 Insomnia due to other mental disorder: Secondary | ICD-10-CM

## 2023-03-24 DIAGNOSIS — F902 Attention-deficit hyperactivity disorder, combined type: Secondary | ICD-10-CM

## 2023-03-24 DIAGNOSIS — F411 Generalized anxiety disorder: Secondary | ICD-10-CM | POA: Diagnosis not present

## 2023-03-24 DIAGNOSIS — F319 Bipolar disorder, unspecified: Secondary | ICD-10-CM | POA: Diagnosis not present

## 2023-03-24 DIAGNOSIS — F99 Mental disorder, not otherwise specified: Secondary | ICD-10-CM

## 2023-03-24 MED ORDER — CARIPRAZINE HCL 3 MG PO CAPS: 3.0000 mg | ORAL_CAPSULE | Freq: Every day | ORAL | 1 refills | Status: DC

## 2023-03-24 MED ORDER — CLONIDINE HCL 0.1 MG PO TABS: 0.1000 mg | ORAL_TABLET | Freq: Two times a day (BID) | ORAL | 1 refills | Status: DC

## 2023-03-24 MED ORDER — AMPHETAMINE-DEXTROAMPHET ER 30 MG PO CP24: 30.0000 mg | ORAL_CAPSULE | Freq: Every day | ORAL | 0 refills | Status: DC

## 2023-03-24 MED ORDER — SERTRALINE HCL 100 MG PO TABS: ORAL_TABLET | ORAL | 1 refills | Status: DC

## 2023-03-24 MED ORDER — QUETIAPINE FUMARATE 100 MG PO TABS: 200.0000 mg | ORAL_TABLET | Freq: Every day | ORAL | 0 refills | Status: DC

## 2023-03-24 MED ORDER — DIVALPROEX SODIUM 500 MG PO DR TAB: 1500.0000 mg | DELAYED_RELEASE_TABLET | Freq: Every evening | ORAL | 0 refills | Status: DC

## 2023-03-24 MED ORDER — LORAZEPAM 1 MG PO TABS: 1.0000 mg | ORAL_TABLET | Freq: Three times a day (TID) | ORAL | 3 refills | Status: AC

## 2023-03-24 MED ORDER — AMPHETAMINE-DEXTROAMPHETAMINE 20 MG PO TABS: ORAL_TABLET | ORAL | 0 refills | Status: DC

## 2023-03-24 NOTE — Progress Notes (Signed)
 [facility]  Patient ID: Oscar Weber,  MRN: [id number]  PCP: Jerel Gee, NP  Date of Evaluation: 03/24/2023 Time spent:30 minutes  Chief Complaint:  Chief Complaint   Anxiety; Depression; Medication Refill; Patient Education; Follow-up     HISTORY/CURRENT STATUS: HPI 43 year old male presents to this office for follow up and medication management.  No changes this visit. Patient would like to continue with current medication regimen. Feeling much more balanced with Vraylar  3 mg.  Says he can tell there is a positive improvement overall. Still working at [employer]. Not entirely happy there but says its a job.  Working 40 plus hours per week.  He reports his anxiety today is 4/10 and depression 3/10. He is sleeping 7-8 hours per night with aid of Seroquel . No mania present, no psychosis. No SI/HI.   Past psychiatric medication trials: Zoloft  Prozac Celexa Lexapro Paxil Wellbutrin Remeron Seroquel  Geodon Depakote   Individual Medical History/ Review of Systems: Changes? :No   Allergies: Bupropion and Gadolinium derivatives  Current Medications:  Current Outpatient Medications:    LORazepam  (ATIVAN ) 1 MG tablet, Take 1 tablet (1 mg total) by mouth every 8 (eight) hours., Disp: 90 tablet, Rfl: 3   albuterol  (VENTOLIN  HFA) 108 (90 Base) MCG/ACT inhaler, Inhale 1-2 puffs into the lungs every 4 (four) hours as needed for wheezing or shortness of breath., Disp: 6.7 g, Rfl: 0   amphetamine -dextroamphetamine  (ADDERALL XR) 30 MG 24 hr capsule, Take 1 capsule (30 mg total) by mouth every morning., Disp: 30 capsule, Rfl: 0   amphetamine -dextroamphetamine  (ADDERALL XR) 30 MG 24 hr capsule, TAKE 1 CAPSULE BY MOUTH EVERY DAY, Disp: 30 capsule, Rfl: 0   [START ON 04/06/2023] amphetamine -dextroamphetamine  (ADDERALL XR) 30 MG 24 hr capsule, Take 1 capsule (30 mg total) by mouth daily., Disp: 30 capsule, Rfl: 0   amphetamine -dextroamphetamine  (ADDERALL) 20 MG tablet, Take 1 tablet  (20 mg total) by mouth daily., Disp: 30 tablet, Rfl: 0   [START ON 04/06/2023] amphetamine -dextroamphetamine  (ADDERALL) 20 MG tablet, Take 1 tablet (20 mg) by mouth daily in the afternoon, Disp: 30 tablet, Rfl: 0   cariprazine  (VRAYLAR ) 3 MG capsule, Take 1 capsule (3 mg total) by mouth daily., Disp: 90 capsule, Rfl: 1   celecoxib (CELEBREX) 200 MG capsule, Take 200 mg by mouth 2 (two) times daily as needed., Disp: , Rfl:    cloNIDine  (CATAPRES ) 0.1 MG tablet, Take 1 tablet (0.1 mg total) by mouth 2 (two) times daily., Disp: 180 tablet, Rfl: 1   divalproex  (DEPAKOTE ) 250 MG DR tablet, Take 2 tablets (500 mg total) by mouth at bedtime., Disp: 180 tablet, Rfl: 1   divalproex  (DEPAKOTE ) 500 MG DR tablet, Take 3 tablets (1,500 mg total) by mouth daily., Disp: 90 tablet, Rfl: 1   divalproex  (DEPAKOTE ) 500 MG DR tablet, Take 3 tablets (1,500 mg total) by mouth at bedtime., Disp: 270 tablet, Rfl: 0   ketoconazole  (NIZORAL ) 2 % cream, Apply 1 Application topically daily., Disp: 15 g, Rfl: 0   LORazepam  (ATIVAN ) 1 MG tablet, Take 1 tablet (1 mg total) by mouth 3 (three) times daily as needed for anxiety., Disp: 270 tablet, Rfl: 0   oxyCODONE -acetaminophen  (PERCOCET) 10-325 MG tablet, Take 1 tablet by mouth every 6 (six) hours as needed., Disp: , Rfl:    QUEtiapine  (SEROQUEL ) 100 MG tablet, Take 2 tablets (200 mg total) by mouth at bedtime., Disp: 60 tablet, Rfl: 0   sertraline  (ZOLOFT ) 100 MG tablet, Take two tablets by mouth dialy,  Disp: 180 tablet, Rfl: 1   triamcinolone cream (KENALOG) 0.1 %, Apply topically 2 (two) times daily., Disp: , Rfl:  Medication Side Effects: none  Family Medical/ Social History: Changes? No  MENTAL HEALTH EXAM:  There were no vitals taken for this visit.There is no height or weight on file to calculate BMI.  General Appearance: Casual, Neat, and Well Groomed  Eye Contact:  Good  Speech:  Clear and Coherent  Volume:  Normal  Mood:  NA  Affect:  Appropriate  Thought  Process:  Coherent  Orientation:  Full (Time, Place, and Person)  Thought Content: Logical   Suicidal Thoughts:  No  Homicidal Thoughts:  No  Memory:  WNL  Judgement:  Good  Insight:  Good  Psychomotor Activity:  Normal  Concentration:  Concentration: Good  Recall:  Good  Fund of Knowledge: Good  Language: Good  Assets:  Desire for Improvement  ADL's:  Intact  Cognition: WNL  Prognosis:  Good    DIAGNOSES:    ICD-10-CM   1. Generalized anxiety disorder  F41.1 divalproex  (DEPAKOTE ) 500 MG DR tablet    sertraline  (ZOLOFT ) 100 MG tablet    cloNIDine  (CATAPRES ) 0.1 MG tablet    QUEtiapine  (SEROQUEL ) 100 MG tablet    LORazepam  (ATIVAN ) 1 MG tablet    2. Insomnia due to other mental disorder  F51.05 divalproex  (DEPAKOTE ) 500 MG DR tablet   F99 QUEtiapine  (SEROQUEL ) 100 MG tablet    3. Bipolar depression (HCC)  F31.9 sertraline  (ZOLOFT ) 100 MG tablet    cariprazine  (VRAYLAR ) 3 MG capsule    4. Attention deficit hyperactivity disorder (ADHD), combined type  F90.2 cloNIDine  (CATAPRES ) 0.1 MG tablet    amphetamine -dextroamphetamine  (ADDERALL XR) 30 MG 24 hr capsule    amphetamine -dextroamphetamine  (ADDERALL) 20 MG tablet      Receiving Psychotherapy: No    RECOMMENDATIONS:   Greater than 50% of  30  min face to face time with patient was spent on counseling and coordination of care. Discussed his current stability. Requesting no changes this visit.  Dermatitis has cleared on face. Reinforced again and discussed his long term use of benzodiazapine's. Reinforced potential benefits, risk, and side effects of benzodiazepines to include potential risk of tolerance and dependence, as well as possible drowsiness.  Advised patient not to drive if experiencing drowsiness and to take lowest possible effective dose to minimize risk of dependence and tolerance.  Discussed potential benefits, risks, and side effects of stimulants with patient to include increased heart rate, palpitations,  insomnia, increased anxiety, increased irritability, or decreased appetite.  Instructed patient to contact office if experiencing any significant tolerability issues.  Discussed potential metabolic side effects associated with atypical antipsychotics, as well as potential risk for movement side effects. Advised pt to contact office if movement side effects occur.     To continue Vraylar  back to 3 mg daily. To continue Adderall 30 mg XR  daily Continue Adderall 20 mg IR daily in afternoon Continue Ativan  1 mg 3 times daily Pt increased Depakote , will continue 1500 mg daily Order Depakote  Levels to [facility]. Instructed patient to go within next week.  To follow up in 4 months to reassess To continue Seroquel  to 200 mg at bedtime. Continue Zoloft  200 mg daily.  Reviewed PDMP     [name], NP

## 2023-05-04 ENCOUNTER — Other Ambulatory Visit: Payer: Self-pay

## 2023-05-04 ENCOUNTER — Telehealth: Payer: Self-pay | Admitting: Behavioral Health

## 2023-05-04 DIAGNOSIS — F902 Attention-deficit hyperactivity disorder, combined type: Secondary | ICD-10-CM

## 2023-05-04 NOTE — Telephone Encounter (Signed)
 Maven called requesting refills on Adderall 20 mg and Adderall XR 30 mg called to:  [id number] [place], [place] - [address] AT [facility] [address]   Phone: [phone]  Fax: [phone]   LM to make an appt

## 2023-05-04 NOTE — Telephone Encounter (Signed)
Made appt for 6/26

## 2023-05-04 NOTE — Telephone Encounter (Signed)
 Pended [facility]der[facility] 30 XR [facility]

## 2023-05-05 MED ORDER — AMPHETAMINE-DEXTROAMPHETAMINE 20 MG PO TABS
ORAL_TABLET | ORAL | 0 refills | Status: DC
Start: 2023-06-29 — End: 2023-09-05

## 2023-05-05 MED ORDER — AMPHETAMINE-DEXTROAMPHET ER 30 MG PO CP24: 30.0000 mg | ORAL_CAPSULE | Freq: Every day | ORAL | 0 refills | Status: DC

## 2023-05-05 MED ORDER — AMPHETAMINE-DEXTROAMPHETAMINE 20 MG PO TABS: ORAL_TABLET | ORAL | 0 refills | Status: DC

## 2023-05-05 MED ORDER — AMPHETAMINE-DEXTROAMPHETAMINE 20 MG PO TABS: 20.0000 mg | ORAL_TABLET | Freq: Every day | ORAL | 0 refills | Status: DC

## 2023-05-05 MED ORDER — AMPHETAMINE-DEXTROAMPHET ER 30 MG PO CP24: ORAL_CAPSULE | ORAL | 0 refills | Status: DC

## 2023-05-05 MED ORDER — AMPHETAMINE-DEXTROAMPHET ER 30 MG PO CP24: 30.0000 mg | ORAL_CAPSULE | Freq: Every morning | ORAL | 0 refills | Status: DC

## 2023-05-15 ENCOUNTER — Telehealth: Payer: Self-pay | Admitting: Behavioral Health

## 2023-05-15 NOTE — Telephone Encounter (Signed)
 Pt calling ahead to determine how he can get his lorazepam filled since he will be on vacation, out of country 4[place]13-4[place]19. Please let him know.

## 2023-05-15 NOTE — Telephone Encounter (Signed)
 LF 3/15, due 4/12

## 2023-05-16 NOTE — Telephone Encounter (Signed)
 Called pharmacy and he picked up lorazepam, 30-day supply on 3/18, not due for RF until 4/17.

## 2023-05-17 ENCOUNTER — Telehealth: Payer: Self-pay | Admitting: Behavioral Health

## 2023-05-17 NOTE — Telephone Encounter (Signed)
 Yes, that will be ok this time.

## 2023-05-17 NOTE — Telephone Encounter (Signed)
 Pt's wife called at 11:00a.  Older message says they are leaving on the 12th.  I asked her and she said the 11th.  [facility]s [facility].

## 2023-05-17 NOTE — Telephone Encounter (Signed)
 Pt has RF already, just need to authorize early RF. Waiting on provider to approve.

## 2023-05-17 NOTE — Telephone Encounter (Signed)
 Patient is going out of the country on 4/12 for a week.  He is asking for an early RF on his lorazepam. I called pharmacy to see when he could fill and based on when he picked it up last he is not due until 4/17.  Is it okay to approve early RF?

## 2023-05-18 NOTE — Telephone Encounter (Signed)
 Verified date leaving with patient and he said 4/11. I called pharmacy today and authorized fill for 4/10 due to going out of the country for vacation.

## 2023-06-26 ENCOUNTER — Telehealth: Payer: Self-pay | Admitting: Behavioral Health

## 2023-06-26 DIAGNOSIS — F5105 Insomnia due to other mental disorder: Secondary | ICD-10-CM

## 2023-06-26 DIAGNOSIS — F411 Generalized anxiety disorder: Secondary | ICD-10-CM

## 2023-06-26 MED ORDER — QUETIAPINE FUMARATE 100 MG PO TABS: 200.0000 mg | ORAL_TABLET | Freq: Every day | ORAL | 1 refills | Status: DC

## 2023-06-26 NOTE — Telephone Encounter (Signed)
 Pt called and said that he also needs lorazapam  1 mg. [facility] [address] He said that he is completely out

## 2023-06-26 NOTE — Telephone Encounter (Signed)
 RF sent.

## 2023-06-26 NOTE — Telephone Encounter (Signed)
 Next appt is 08/10/23. Requesting refill on Quetiapine  called to:  [id number] [place], [place] - [address] AT [facility] [address]    Phone: [phone]  Fax: [phone]

## 2023-06-26 NOTE — Telephone Encounter (Signed)
 Pt has a RF available. Confirmed with pharmacy and asked them to fill today. Notified patient.

## 2023-07-27 ENCOUNTER — Other Ambulatory Visit: Payer: Self-pay

## 2023-07-27 ENCOUNTER — Telehealth: Payer: Self-pay | Admitting: Behavioral Health

## 2023-07-27 DIAGNOSIS — F319 Bipolar disorder, unspecified: Secondary | ICD-10-CM

## 2023-07-27 DIAGNOSIS — F411 Generalized anxiety disorder: Secondary | ICD-10-CM

## 2023-07-27 MED ORDER — LORAZEPAM 1 MG PO TABS: 1.0000 mg | ORAL_TABLET | Freq: Three times a day (TID) | ORAL | 0 refills | Status: DC | PRN

## 2023-07-27 NOTE — Telephone Encounter (Signed)
 PT lvm that he needs a new script of the lorazapam 1 mg.  Pharmacy is [facility] on [address]. Next appt 08/10/23

## 2023-07-27 NOTE — Telephone Encounter (Signed)
 Pended alprazolam to [facility]

## 2023-08-07 ENCOUNTER — Telehealth: Payer: Self-pay | Admitting: Behavioral Health

## 2023-08-07 ENCOUNTER — Other Ambulatory Visit: Payer: Self-pay

## 2023-08-07 DIAGNOSIS — F902 Attention-deficit hyperactivity disorder, combined type: Secondary | ICD-10-CM

## 2023-08-07 MED ORDER — AMPHETAMINE-DEXTROAMPHETAMINE 20 MG PO TABS: 20.0000 mg | ORAL_TABLET | Freq: Every day | ORAL | 0 refills | Status: DC

## 2023-08-07 MED ORDER — AMPHETAMINE-DEXTROAMPHET ER 30 MG PO CP24: ORAL_CAPSULE | ORAL | 0 refills | Status: DC

## 2023-08-07 NOTE — Telephone Encounter (Signed)
 [facility] [address]

## 2023-08-07 NOTE — Telephone Encounter (Signed)
 Pt lvm requesting Rx  Adderall XR 30 mg and Adderall 20 IR to [facility] [address] apt 6/26

## 2023-08-10 ENCOUNTER — Encounter: Payer: Self-pay | Admitting: Behavioral Health

## 2023-08-10 ENCOUNTER — Telehealth (INDEPENDENT_AMBULATORY_CARE_PROVIDER_SITE_OTHER): Admitting: Behavioral Health

## 2023-08-10 DIAGNOSIS — F99 Mental disorder, not otherwise specified: Secondary | ICD-10-CM

## 2023-08-10 DIAGNOSIS — F319 Bipolar disorder, unspecified: Secondary | ICD-10-CM | POA: Diagnosis not present

## 2023-08-10 DIAGNOSIS — F5105 Insomnia due to other mental disorder: Secondary | ICD-10-CM | POA: Diagnosis not present

## 2023-08-10 DIAGNOSIS — F902 Attention-deficit hyperactivity disorder, combined type: Secondary | ICD-10-CM

## 2023-08-10 DIAGNOSIS — F411 Generalized anxiety disorder: Secondary | ICD-10-CM

## 2023-08-10 MED ORDER — SERTRALINE HCL 100 MG PO TABS: ORAL_TABLET | ORAL | 1 refills | Status: DC

## 2023-08-10 MED ORDER — QUETIAPINE FUMARATE 100 MG PO TABS: 200.0000 mg | ORAL_TABLET | Freq: Every day | ORAL | 1 refills | Status: DC

## 2023-08-10 MED ORDER — DIVALPROEX SODIUM 500 MG PO DR TAB: 1500.0000 mg | DELAYED_RELEASE_TABLET | Freq: Every evening | ORAL | 0 refills | Status: DC

## 2023-08-10 MED ORDER — LORAZEPAM 1 MG PO TABS
1.0000 mg | ORAL_TABLET | Freq: Three times a day (TID) | ORAL | 0 refills | Status: DC | PRN
Start: 2023-08-25 — End: 2023-09-26

## 2023-08-10 MED ORDER — CARIPRAZINE HCL 3 MG PO CAPS: 3.0000 mg | ORAL_CAPSULE | Freq: Every day | ORAL | 1 refills | Status: DC

## 2023-08-10 MED ORDER — CLONIDINE HCL 0.1 MG PO TABS: 0.1000 mg | ORAL_TABLET | Freq: Two times a day (BID) | ORAL | 1 refills | Status: AC

## 2023-08-10 NOTE — Progress Notes (Signed)
 Oscar Weber [id number] 09-Nov-1980 43 y.o.  Virtual Visit via Video Note  I connected with pt @ on 08/10/23 at  3:30 PM EDT by a video enabled telemedicine application and verified that I am speaking with the correct person using two identifiers.   I discussed the limitations of evaluation and management by telemedicine and the availability of in person appointments. The patient expressed understanding and agreed to proceed.  I discussed the assessment and treatment plan with the patient. The patient was provided an opportunity to ask questions and all were answered. The patient agreed with the plan and demonstrated an understanding of the instructions.   The patient was advised to call back or seek an in-person evaluation if the symptoms worsen or if the condition fails to improve as anticipated.  I provided 30 minutes of non-face-to-face time during this encounter.  The patient was located at home.  The provider was located at [facility].   Oscar DELENA Pizza, NP   Subjective:   Patient ID:  Oscar Weber is a 43 y.o. (DOB 1980/09/29) male.  Chief Complaint: No chief complaint on file.   HPI 43 year old male presents to this office for follow up and medication management.  No changes this visit. Patient would like to continue with current medication regimen. Says he feels balanced right now. He says he and his wife are going on vacation tomorrow to [place]. Requesting early fill again with Ativan . Says he will be gone 10 days on trip.  Still working at [employer]. Not entirely happy there but says its a job.  Working 40 plus hours per week.  He reports his anxiety today is 3/10 and depression 3/10. He is sleeping 7-8 hours per night with aid of Seroquel . No mania present, no psychosis. No SI/HI.   Past psychiatric medication trials: Zoloft  Prozac Celexa Lexapro Paxil Wellbutrin Remeron Seroquel  Geodon Depakote     Review of Systems:  Review of Systems  Constitutional:  Negative.   Allergic/Immunologic: Negative.   Neurological: Negative.   Psychiatric/Behavioral: Negative.      Medications: I have reviewed the patient's current medications.  Current Outpatient Medications  Medication Sig Dispense Refill   albuterol  (VENTOLIN  HFA) 108 (90 Base) MCG/ACT inhaler Inhale 1-2 puffs into the lungs every 4 (four) hours as needed for wheezing or shortness of breath. 6.7 g 0   amphetamine -dextroamphetamine  (ADDERALL XR) 30 MG 24 hr capsule Take 1 capsule (30 mg total) by mouth daily. 30 capsule 0   amphetamine -dextroamphetamine  (ADDERALL XR) 30 MG 24 hr capsule Take 1 capsule (30 mg total) by mouth every morning. 30 capsule 0   amphetamine -dextroamphetamine  (ADDERALL XR) 30 MG 24 hr capsule TAKE 1 CAPSULE BY MOUTH EVERY DAY 30 capsule 0   amphetamine -dextroamphetamine  (ADDERALL) 20 MG tablet Take 1 tablet (20 mg) by mouth daily in the afternoon 30 tablet 0   amphetamine -dextroamphetamine  (ADDERALL) 20 MG tablet Take 1 tablet (20 mg) by mouth daily in the afternoon 30 tablet 0   amphetamine -dextroamphetamine  (ADDERALL) 20 MG tablet Take 1 tablet (20 mg total) by mouth daily. 30 tablet 0   cariprazine  (VRAYLAR ) 3 MG capsule Take 1 capsule (3 mg total) by mouth daily. 90 capsule 1   celecoxib (CELEBREX) 200 MG capsule Take 200 mg by mouth 2 (two) times daily as needed.     cloNIDine  (CATAPRES ) 0.1 MG tablet Take 1 tablet (0.1 mg total) by mouth 2 (two) times daily. 180 tablet 1   divalproex  (DEPAKOTE ) 250 MG DR tablet  Take 2 tablets (500 mg total) by mouth at bedtime. 180 tablet 1   divalproex  (DEPAKOTE ) 500 MG DR tablet Take 3 tablets (1,500 mg total) by mouth daily. 90 tablet 1   divalproex  (DEPAKOTE ) 500 MG DR tablet Take 3 tablets (1,500 mg total) by mouth at bedtime. 270 tablet 0   ketoconazole  (NIZORAL ) 2 % cream Apply 1 Application topically daily. 15 g 0   LORazepam  (ATIVAN ) 1 MG tablet Take 1 tablet (1 mg total) by mouth every 8 (eight) hours. 90 tablet 3    [START ON 08/25/2023] LORazepam  (ATIVAN ) 1 MG tablet Take 1 tablet (1 mg total) by mouth 3 (three) times daily as needed for anxiety. 90 tablet 0   oxyCODONE -acetaminophen  (PERCOCET) 10-325 MG tablet Take 1 tablet by mouth every 6 (six) hours as needed.     QUEtiapine  (SEROQUEL ) 100 MG tablet Take 2 tablets (200 mg total) by mouth at bedtime. 60 tablet 1   sertraline  (ZOLOFT ) 100 MG tablet Take two tablets by mouth dialy 180 tablet 1   triamcinolone cream (KENALOG) 0.1 % Apply topically 2 (two) times daily.     No current facility-administered medications for this visit.    Medication Side Effects: None  Allergies:  Allergies  Allergen Reactions   Bupropion     Other reaction(s): Other (See Comments) Visual issues Visual issues    Gadolinium Derivatives Nausea And Vomiting    Nasal Problems Nasal Problems     Past Medical History:  Diagnosis Date   Anxiety    Chronic pain    Depression    Hypertension     No family history on file.  Social History   Socioeconomic History   Marital status: Married    Spouse name: Not on file   Number of children: Not on file   Years of education: Not on file   Highest education level: Not on file  Occupational History    Comment: [employer]  Tobacco Use   Smoking status: Never   Smokeless tobacco: Never  Vaping Use   Vaping status: Never Used  Substance and Sexual Activity   Alcohol  use: Not Currently    Comment: rarely   Drug use: Yes    Types: Marijuana    Comment: daily   Sexual activity: Yes    Comment: married  Other Topics Concern   Not on file  Social History Narrative   Lives with wife in [place]. They have one daughter at home.   Social Drivers of Corporate investment banker Strain: Low Risk  (06/22/2023)   Received from [facility]   Overall Financial Resource Strain (CARDIA)    Difficulty of Paying Living Expenses: Not hard at all  Food Insecurity: No Food Insecurity (06/22/2023)   Received from  [facility]   Hunger Vital Sign    Within the past 12 months, you worried that your food would run out before you got the money to buy more.: Never true    Within the past 12 months, the food you bought just didn't last and you didn't have money to get more.: Never true  Transportation Needs: No Transportation Needs (06/22/2023)   Received from [facility] - Transportation    Lack of Transportation (Medical): No    Lack of Transportation (Non-Medical): No  Physical Activity: Not on file  Stress: Not on file  Social Connections: Not on file  Intimate Partner Violence: Not on file    Past Medical History, Surgical history, Social  history, and Family history were reviewed and updated as appropriate.   Please see review of systems for further details on the patient's review from today.   Objective:   Physical Exam:  There were no vitals taken for this visit.  Physical Exam Constitutional:      General: He is not in acute distress.    Appearance: Normal appearance.   Neurological:     Mental Status: He is alert and oriented to person, place, and time.     Gait: Gait normal.   Psychiatric:        Attention and Perception: Attention and perception normal. He does not perceive auditory or visual hallucinations.        Mood and Affect: Mood and affect normal. Mood is not anxious or depressed. Affect is not labile.        Speech: Speech normal.        Behavior: Behavior normal. Behavior is cooperative.        Thought Content: Thought content normal.        Cognition and Memory: Cognition and memory normal.        Judgment: Judgment normal.     Lab Review:  No results found for: NA, K, CL, CO2, GLUCOSE, BUN, CREATININE, CALCIUM, PROT, ALBUMIN, AST, ALT, ALKPHOS, BILITOT, GFRNONAA, GFRAA  No results found for: WBC, RBC, HGB, HCT, PLT, MCV, MCH, MCHC, RDW, LYMPHSABS, MONOABS, EOSABS, BASOSABS  No results found  for: POCLITH, LITHIUM   Lab Results  Component Value Date   VALPROATE 49 (L) 10/07/2020     .res Assessment: Plan:   Greater than 50% of  30  min face to face time with patient was spent on counseling and coordination of care. Discussed his current stability. He feels much better on Vraylar  3 mg. Requesting no changes this visit.  He is requesting early fill on Ativan  again but after chart review it is way to early. States that he is leaving for vacation on cruise to the [place] tomorrow.  Pharmacy is not going to fill this early.     Reinforced again and discussed his long term use of benzodiazapine's. Reinforced potential benefits, risk, and side effects of benzodiazepines to include potential risk of tolerance and dependence, as well as possible drowsiness.  Advised patient not to drive if experiencing drowsiness and to take lowest possible effective dose to minimize risk of dependence and tolerance.  Discussed potential benefits, risks, and side effects of stimulants with patient to include increased heart rate, palpitations, insomnia, increased anxiety, increased irritability, or decreased appetite.  Instructed patient to contact office if experiencing any significant tolerability issues.  Discussed potential metabolic side effects associated with atypical antipsychotics, as well as potential risk for movement side effects. Advised pt to contact office if movement side effects occur.     To continue Vraylar  back to 3 mg daily. To continue Adderall 30 mg XR  daily Continue Adderall 20 mg IR daily in afternoon Continue Ativan  1 mg 3 times daily Pt increased Depakote , will continue 1500 mg daily Order Depakote  Levels to [employer] [employer]. Instructed patient to go within next week.  To follow up in 4 months to reassess To continue Seroquel  to 200 mg at bedtime. Continue Zoloft  200 mg daily.      Diagnoses and all orders for this visit:  Bipolar depression (HCC) -      sertraline  (ZOLOFT ) 100 MG tablet; Take two tablets by mouth dialy -     cariprazine  (VRAYLAR ) 3 MG capsule;  Take 1 capsule (3 mg total) by mouth daily. -     LORazepam  (ATIVAN ) 1 MG tablet; Take 1 tablet (1 mg total) by mouth 3 (three) times daily as needed for anxiety.  Generalized anxiety disorder -     divalproex  (DEPAKOTE ) 500 MG DR tablet; Take 3 tablets (1,500 mg total) by mouth at bedtime. -     sertraline  (ZOLOFT ) 100 MG tablet; Take two tablets by mouth dialy -     cloNIDine  (CATAPRES ) 0.1 MG tablet; Take 1 tablet (0.1 mg total) by mouth 2 (two) times daily. -     QUEtiapine  (SEROQUEL ) 100 MG tablet; Take 2 tablets (200 mg total) by mouth at bedtime. -     LORazepam  (ATIVAN ) 1 MG tablet; Take 1 tablet (1 mg total) by mouth 3 (three) times daily as needed for anxiety.  Insomnia due to other mental disorder -     divalproex  (DEPAKOTE ) 500 MG DR tablet; Take 3 tablets (1,500 mg total) by mouth at bedtime. -     QUEtiapine  (SEROQUEL ) 100 MG tablet; Take 2 tablets (200 mg total) by mouth at bedtime.  Attention deficit hyperactivity disorder (ADHD), combined type -     cloNIDine  (CATAPRES ) 0.1 MG tablet; Take 1 tablet (0.1 mg total) by mouth 2 (two) times daily.     Please see After Visit Summary for patient specific instructions.  No future appointments.  No orders of the defined types were placed in this encounter.     -------------------------------

## 2023-09-04 ENCOUNTER — Telehealth: Payer: Self-pay | Admitting: Behavioral Health

## 2023-09-04 NOTE — Telephone Encounter (Signed)
LF 6/24, due 7/22

## 2023-09-04 NOTE — Telephone Encounter (Signed)
 A 90-day supply with 1 RF was sent in June for Vraylar .

## 2023-09-04 NOTE — Telephone Encounter (Signed)
 Pt called back asking for a refill on his vraylar  as well

## 2023-09-04 NOTE — Telephone Encounter (Signed)
 Pt called for f/u apt 9/30 and request Adderall XR 30 mg & IR 20 mg  [facility]

## 2023-09-05 ENCOUNTER — Other Ambulatory Visit: Payer: Self-pay

## 2023-09-05 DIAGNOSIS — F902 Attention-deficit hyperactivity disorder, combined type: Secondary | ICD-10-CM

## 2023-09-05 MED ORDER — AMPHETAMINE-DEXTROAMPHET ER 30 MG PO CP24: 30.0000 mg | ORAL_CAPSULE | Freq: Every morning | ORAL | 0 refills | Status: DC

## 2023-09-05 MED ORDER — AMPHETAMINE-DEXTROAMPHETAMINE 20 MG PO TABS: ORAL_TABLET | ORAL | 0 refills | Status: DC

## 2023-09-05 MED ORDER — AMPHETAMINE-DEXTROAMPHET ER 30 MG PO CP24: 30.0000 mg | ORAL_CAPSULE | Freq: Every day | ORAL | 0 refills | Status: DC

## 2023-09-05 NOTE — Telephone Encounter (Signed)
 Tried to contact patient about Vraylar , VM has not been set up yet. Pended Adderall.

## 2023-09-21 ENCOUNTER — Telehealth: Payer: Self-pay | Admitting: Behavioral Health

## 2023-09-21 NOTE — Telephone Encounter (Signed)
 08/29/2023 08/10/2023 1  Lorazepam  1 Mg Tablet 90.00  Due 8/12

## 2023-09-21 NOTE — Telephone Encounter (Signed)
 Pt called asking for a refill on his lorazapam 1 mg 3 x d. [facility] [address]  [address]. Next appt is september

## 2023-09-23 ENCOUNTER — Other Ambulatory Visit: Payer: Self-pay | Admitting: Behavioral Health

## 2023-09-23 DIAGNOSIS — F319 Bipolar disorder, unspecified: Secondary | ICD-10-CM

## 2023-09-23 DIAGNOSIS — F411 Generalized anxiety disorder: Secondary | ICD-10-CM

## 2023-09-26 ENCOUNTER — Other Ambulatory Visit: Payer: Self-pay

## 2023-09-26 NOTE — Telephone Encounter (Signed)
 Addressed thru pharmacy interface.

## 2023-09-26 NOTE — Telephone Encounter (Signed)
 Asking for RF of lorazepam 

## 2023-10-04 ENCOUNTER — Telehealth: Payer: Self-pay | Admitting: Behavioral Health

## 2023-10-04 NOTE — Telephone Encounter (Signed)
 Next visit is 11/14/23. Oscar Weber is requesting a refill on his Adderall XR 30 mg and Adderall IR 20 mg called to:  [id number] [place] [place] - [address] AT [address]   Phone: [phone]  Fax: [phone]

## 2023-10-04 NOTE — Telephone Encounter (Signed)
 Pt has [facility] available [facility] He was notified.

## 2023-11-08 ENCOUNTER — Other Ambulatory Visit: Payer: Self-pay

## 2023-11-08 ENCOUNTER — Telehealth: Payer: Self-pay | Admitting: Behavioral Health

## 2023-11-08 DIAGNOSIS — F902 Attention-deficit hyperactivity disorder, combined type: Secondary | ICD-10-CM

## 2023-11-08 MED ORDER — AMPHETAMINE-DEXTROAMPHET ER 30 MG PO CP24: ORAL_CAPSULE | ORAL | 0 refills | Status: DC

## 2023-11-08 MED ORDER — AMPHETAMINE-DEXTROAMPHETAMINE 20 MG PO TABS: 20.0000 mg | ORAL_TABLET | Freq: Every day | ORAL | 0 refills | Status: DC

## 2023-11-08 NOTE — Telephone Encounter (Signed)
 Pt requesting Rx adderall XR 30 mg and adderall IR 20 mg. [facility]. Apt 9/30

## 2023-11-08 NOTE — Telephone Encounter (Signed)
Pended both doses.

## 2023-11-14 ENCOUNTER — Telehealth: Admitting: Behavioral Health

## 2023-11-16 ENCOUNTER — Encounter: Payer: Self-pay | Admitting: Behavioral Health

## 2023-11-16 ENCOUNTER — Telehealth (INDEPENDENT_AMBULATORY_CARE_PROVIDER_SITE_OTHER): Admitting: Behavioral Health

## 2023-11-16 DIAGNOSIS — F99 Mental disorder, not otherwise specified: Secondary | ICD-10-CM

## 2023-11-16 DIAGNOSIS — F411 Generalized anxiety disorder: Secondary | ICD-10-CM

## 2023-11-16 DIAGNOSIS — F902 Attention-deficit hyperactivity disorder, combined type: Secondary | ICD-10-CM

## 2023-11-16 DIAGNOSIS — F319 Bipolar disorder, unspecified: Secondary | ICD-10-CM

## 2023-11-16 DIAGNOSIS — F5105 Insomnia due to other mental disorder: Secondary | ICD-10-CM | POA: Diagnosis not present

## 2023-11-16 NOTE — Progress Notes (Signed)
 Oscar Weber [id number] 12/22/1980 43 y.o.  Virtual Visit via Video Note  I connected with pt @ on 11/16/23 at  3:30 PM EDT by a video enabled telemedicine application and verified that I am speaking with the correct person using two identifiers.   I discussed the limitations of evaluation and management by telemedicine and the availability of in person appointments. The patient expressed understanding and agreed to proceed.  I discussed the assessment and treatment plan with the patient. The patient was provided an opportunity to ask questions and all were answered. The patient agreed with the plan and demonstrated an understanding of the instructions.   The patient was advised to call back or seek an in-person evaluation if the symptoms worsen or if the condition fails to improve as anticipated.  I provided 30 minutes of non-face-to-face time during this encounter.  The patient was located at home.  The provider was located at [facility].   [name], NP   Subjective:   Patient ID:  Oscar Weber is a 43 y.o. (DOB 08/05/1980) male.  Chief Complaint:  Chief Complaint  Patient presents with   Anxiety   Depression   Medication Refill   Follow-up   Patient Education    HPI 43 year old male presents to this office via video visit for follow up and medication management.  No changes this visit. Patient would like to continue with current medication regimen. Says he feels balanced right now. He seems bright today and pleasant. Feels like he is doing better with remembering to take medications as prescribed recently.   Working 40 plus hours per week.  He reports his anxiety today is 3/10 and depression 3/10. He is sleeping 7-8 hours per night with aid of Seroquel . No mania present, no psychosis. No SI/HI.   Past psychiatric medication trials: Zoloft  Prozac Celexa Lexapro Paxil Wellbutrin Remeron Seroquel  Geodon Depakote          Review of Systems:   Review of Systems  Constitutional: Negative.   Allergic/Immunologic: Negative.   Neurological: Negative.   Psychiatric/Behavioral:  Positive for dysphoric mood.     Medications: I have reviewed the patient's current medications.  Current Outpatient Medications  Medication Sig Dispense Refill   albuterol  (VENTOLIN  HFA) 108 (90 Base) MCG/ACT inhaler Inhale 1-2 puffs into the lungs every 4 (four) hours as needed for wheezing or shortness of breath. 6.7 g 0   amphetamine -dextroamphetamine  (ADDERALL XR) 30 MG 24 hr capsule Take 1 capsule (30 mg total) by mouth every morning. 30 capsule 0   amphetamine -dextroamphetamine  (ADDERALL XR) 30 MG 24 hr capsule Take 1 capsule (30 mg total) by mouth daily. 30 capsule 0   amphetamine -dextroamphetamine  (ADDERALL XR) 30 MG 24 hr capsule TAKE 1 CAPSULE BY MOUTH EVERY DAY 30 capsule 0   amphetamine -dextroamphetamine  (ADDERALL) 20 MG tablet Take 1 tablet (20 mg) by mouth daily in the afternoon 30 tablet 0   amphetamine -dextroamphetamine  (ADDERALL) 20 MG tablet Take 1 tablet (20 mg) by mouth daily in the afternoon 30 tablet 0   amphetamine -dextroamphetamine  (ADDERALL) 20 MG tablet Take 1 tablet (20 mg total) by mouth daily. 30 tablet 0   cariprazine  (VRAYLAR ) 3 MG capsule Take 1 capsule (3 mg total) by mouth daily. 90 capsule 1   celecoxib (CELEBREX) 200 MG capsule Take 200 mg by mouth 2 (two) times daily as needed.     cloNIDine  (CATAPRES ) 0.1 MG tablet Take 1 tablet (0.1 mg total) by mouth 2 (two) times daily. 180 tablet  1   divalproex  (DEPAKOTE ) 250 MG DR tablet Take 2 tablets (500 mg total) by mouth at bedtime. 180 tablet 1   divalproex  (DEPAKOTE ) 500 MG DR tablet Take 3 tablets (1,500 mg total) by mouth daily. 90 tablet 1   divalproex  (DEPAKOTE ) 500 MG DR tablet Take 3 tablets (1,500 mg total) by mouth at bedtime. 270 tablet 0   ketoconazole  (NIZORAL ) 2 % cream Apply 1 Application topically daily. 15 g 0   LORazepam  (ATIVAN ) 1 MG tablet Take 1 tablet (1 mg  total) by mouth every 8 (eight) hours. 90 tablet 3   LORazepam  (ATIVAN ) 1 MG tablet TAKE 1 TABLET BY MOUTH 3 TIMES DAILY AS NEEDED FOR ANXIETY. 90 tablet 1   oxyCODONE -acetaminophen  (PERCOCET) 10-325 MG tablet Take 1 tablet by mouth every 6 (six) hours as needed.     QUEtiapine  (SEROQUEL ) 100 MG tablet Take 2 tablets (200 mg total) by mouth at bedtime. 60 tablet 1   sertraline  (ZOLOFT ) 100 MG tablet Take two tablets by mouth dialy 180 tablet 1   triamcinolone cream (KENALOG) 0.1 % Apply topically 2 (two) times daily.     No current facility-administered medications for this visit.    Medication Side Effects: None  Allergies:  Allergies  Allergen Reactions   Bupropion     Other reaction(s): Other (See Comments) Visual issues Visual issues    Gadolinium Derivatives Nausea And Vomiting    Nasal Problems Nasal Problems     Past Medical History:  Diagnosis Date   Anxiety    Chronic pain    Depression    Hypertension     No family history on file.  Social History   Socioeconomic History   Marital status: Married    Spouse name: Not on file   Number of children: Not on file   Years of education: Not on file   Highest education level: Not on file  Occupational History    Comment: [employer]  Tobacco Use   Smoking status: Never   Smokeless tobacco: Never  Vaping Use   Vaping status: Never Used  Substance and Sexual Activity   Alcohol  use: Not Currently    Comment: rarely   Drug use: Yes    Types: Marijuana    Comment: daily   Sexual activity: Yes    Comment: married  Other Topics Concern   Not on file  Social History Narrative   Lives with wife in [place]. They have one daughter at home.   Social Drivers of Corporate investment banker Strain: Low Risk  (06/22/2023)   Received from [facility]   Overall Financial Resource Strain (CARDIA)    Difficulty of Paying Living Expenses: Not hard at all  Food Insecurity: No Food Insecurity (06/22/2023)    Received from [facility]   Hunger Vital Sign    Within the past 12 months, you worried that your food would run out before you got the money to buy more.: Never true    Within the past 12 months, the food you bought just didn't last and you didn't have money to get more.: Never true  Transportation Needs: No Transportation Needs (06/22/2023)   Received from [facility] - Transportation    Lack of Transportation (Medical): No    Lack of Transportation (Non-Medical): No  Physical Activity: Not on file  Stress: Not on file  Social Connections: Not on file  Intimate Partner Violence: Not on file    Past Medical History, Surgical  history, Social history, and Family history were reviewed and updated as appropriate.   Please see review of systems for further details on the patient's review from today.   Objective:   Physical Exam:  There were no vitals taken for this visit.  Physical Exam  Lab Review:  No results found for: NA, K, CL, CO2, GLUCOSE, BUN, CREATININE, CALCIUM, PROT, ALBUMIN, AST, ALT, ALKPHOS, BILITOT, GFRNONAA, GFRAA  No results found for: WBC, RBC, HGB, HCT, PLT, MCV, MCH, MCHC, RDW, LYMPHSABS, MONOABS, EOSABS, BASOSABS  No results found for: POCLITH, LITHIUM   Lab Results  Component Value Date   VALPROATE 49 (L) 10/07/2020     .res Assessment: Plan:    Greater than 50% of 30 min  video visit time with patient was spent on counseling and coordination of care. Discussed his current good stability this visit. Is smiling and pleasant. Reviewed medications and he reports taking as prescribed. Appetite is good. Sleep has been much improved. Requesting no changes this visit.      Reinforced again and discussed his long term use of benzodiazapine's. Reinforced potential benefits, risk, and side effects of benzodiazepines to include potential risk of tolerance and dependence, as well as possible  drowsiness.  Advised patient not to drive if experiencing drowsiness and to take lowest possible effective dose to minimize risk of dependence and tolerance.  Discussed potential benefits, risks, and side effects of stimulants with patient to include increased heart rate, palpitations, insomnia, increased anxiety, increased irritability, or decreased appetite.  Instructed patient to contact office if experiencing any significant tolerability issues.  Discussed potential metabolic side effects associated with atypical antipsychotics, as well as potential risk for movement side effects. Advised pt to contact office if movement side effects occur.     To continue Vraylar  back to 3 mg daily. To continue Adderall 30 mg XR  daily Continue Adderall 20 mg IR daily in afternoon Continue Ativan  1 mg 3 times daily Continue  Depakote , will continue 1500 mg daily Order Depakote  Levels to [employer]. Instructed patient to go within next week.  To follow up in 4 months to reassess To continue Seroquel  to 200 mg at bedtime. Continue Zoloft  200 mg daily.               Rosalio was seen today for anxiety, depression, medication refill, follow-up and patient education.  Diagnoses and all orders for this visit:  Attention deficit hyperactivity disorder (ADHD), combined type  Bipolar depression (HCC)  Generalized anxiety disorder  Insomnia due to other mental disorder     Please see After Visit Summary for patient specific instructions.  No future appointments.  No orders of the defined types were placed in this encounter.     -------------------------------

## 2023-11-19 ENCOUNTER — Other Ambulatory Visit: Payer: Self-pay | Admitting: Behavioral Health

## 2023-11-19 DIAGNOSIS — F411 Generalized anxiety disorder: Secondary | ICD-10-CM

## 2023-11-19 DIAGNOSIS — F5105 Insomnia due to other mental disorder: Secondary | ICD-10-CM

## 2023-11-23 ENCOUNTER — Other Ambulatory Visit: Payer: Self-pay | Admitting: Behavioral Health

## 2023-11-23 DIAGNOSIS — F5105 Insomnia due to other mental disorder: Secondary | ICD-10-CM

## 2023-11-23 DIAGNOSIS — F319 Bipolar disorder, unspecified: Secondary | ICD-10-CM

## 2023-11-23 DIAGNOSIS — F411 Generalized anxiety disorder: Secondary | ICD-10-CM

## 2023-11-23 NOTE — Telephone Encounter (Signed)
 Pt said he doesn't have any for tomorrow

## 2023-11-23 NOTE — Telephone Encounter (Signed)
 Pt LVM@ 12:49p requesting Lorazepam .  He is asking for 90 day script to   [id number] [place] [place] - [address] AT [address] [place] [address] Phone: [phone]  Fax: [phone]    No upcoming appt scheduled.

## 2023-11-24 NOTE — Telephone Encounter (Signed)
 Pt left vm stating he still doesn't have his Lorazepam  and he can't go without it .

## 2023-12-11 ENCOUNTER — Telehealth: Payer: Self-pay | Admitting: Behavioral Health

## 2023-12-11 NOTE — Telephone Encounter (Signed)
 Pt called asking for a refill on his adderall xr 30 mg and his adderall 20 mg. He needs 3 scripts sent of both doses. Pharmacy is [facility] on [address].

## 2023-12-12 ENCOUNTER — Other Ambulatory Visit: Payer: Self-pay

## 2023-12-12 DIAGNOSIS — F902 Attention-deficit hyperactivity disorder, combined type: Secondary | ICD-10-CM

## 2023-12-12 MED ORDER — AMPHETAMINE-DEXTROAMPHETAMINE 20 MG PO TABS: ORAL_TABLET | ORAL | 0 refills | Status: AC

## 2023-12-12 MED ORDER — AMPHETAMINE-DEXTROAMPHETAMINE 20 MG PO TABS: ORAL_TABLET | ORAL | 0 refills | Status: DC

## 2023-12-12 MED ORDER — AMPHETAMINE-DEXTROAMPHET ER 30 MG PO CP24: 30.0000 mg | ORAL_CAPSULE | Freq: Every day | ORAL | 0 refills | Status: AC

## 2023-12-12 MED ORDER — AMPHETAMINE-DEXTROAMPHETAMINE 20 MG PO TABS: 20.0000 mg | ORAL_TABLET | Freq: Every day | ORAL | 0 refills | Status: AC

## 2023-12-12 MED ORDER — AMPHETAMINE-DEXTROAMPHET ER 30 MG PO CP24
ORAL_CAPSULE | ORAL | 0 refills | Status: AC
Start: 2024-01-09 — End: 2024-01-10

## 2023-12-12 MED ORDER — AMPHETAMINE-DEXTROAMPHET ER 30 MG PO CP24: 30.0000 mg | ORAL_CAPSULE | Freq: Every morning | ORAL | 0 refills | Status: DC

## 2023-12-12 NOTE — Telephone Encounter (Signed)
 Pended 3 RF of both Adderall doses 30 XR and 20

## 2023-12-12 NOTE — Telephone Encounter (Signed)
 Pt called to check status of rf rq. States he only has enough till tomorrow.

## 2023-12-19 ENCOUNTER — Telehealth: Payer: Self-pay | Admitting: Behavioral Health

## 2023-12-19 NOTE — Telephone Encounter (Addendum)
 [facility] was [facility] not [facility]. Called [facility] and we had sent 3 RF for both the Adderall XR 30 and Adderall IR 20. Pharmacist said they clicked on the wrong date to fill. They will fill today. Pt already picked up IR 20.   Wife notified.

## 2023-12-19 NOTE — Telephone Encounter (Signed)
 I called pt's wife back to say we sent the medication as requested to [facility].  She said they talked to [facility] and [facility] and they can't get it.  Pls call them to see what the issue is.  It's only the extended release that he is needing.  Thanks

## 2023-12-19 NOTE — Telephone Encounter (Signed)
 Encounter not needed; medication sent to requested pharmacy.

## 2024-01-09 ENCOUNTER — Other Ambulatory Visit: Payer: Self-pay | Admitting: Behavioral Health

## 2024-01-09 DIAGNOSIS — F319 Bipolar disorder, unspecified: Secondary | ICD-10-CM

## 2024-01-09 DIAGNOSIS — F411 Generalized anxiety disorder: Secondary | ICD-10-CM

## 2024-01-09 NOTE — Telephone Encounter (Signed)
 Not due til end of December, Refuse

## 2024-02-03 ENCOUNTER — Other Ambulatory Visit: Payer: Self-pay | Admitting: Behavioral Health

## 2024-02-03 DIAGNOSIS — F411 Generalized anxiety disorder: Secondary | ICD-10-CM

## 2024-02-03 DIAGNOSIS — F319 Bipolar disorder, unspecified: Secondary | ICD-10-CM

## 2024-02-08 ENCOUNTER — Other Ambulatory Visit: Payer: Self-pay | Admitting: Behavioral Health

## 2024-02-08 DIAGNOSIS — F319 Bipolar disorder, unspecified: Secondary | ICD-10-CM

## 2024-02-12 ENCOUNTER — Other Ambulatory Visit: Payer: Self-pay | Admitting: Behavioral Health

## 2024-02-12 DIAGNOSIS — F411 Generalized anxiety disorder: Secondary | ICD-10-CM

## 2024-02-12 DIAGNOSIS — F99 Mental disorder, not otherwise specified: Secondary | ICD-10-CM

## 2024-02-22 ENCOUNTER — Telehealth: Payer: Self-pay | Admitting: Behavioral Health

## 2024-02-22 NOTE — Telephone Encounter (Signed)
 Patient called in for refill on Lorazepam  1mg . He is down to last couple of pills and has enough to get through tomorrow. Ph: [phone] Pharmacy [facility] [address] Ruthellen CHILD

## 2024-02-22 NOTE — Telephone Encounter (Signed)
 Last refill #90 12/10 Will pend for [place]

## 2024-02-23 ENCOUNTER — Other Ambulatory Visit: Payer: Self-pay

## 2024-02-23 DIAGNOSIS — F411 Generalized anxiety disorder: Secondary | ICD-10-CM

## 2024-02-23 DIAGNOSIS — F319 Bipolar disorder, unspecified: Secondary | ICD-10-CM

## 2024-02-23 MED ORDER — LORAZEPAM 1 MG PO TABS: 1.0000 mg | ORAL_TABLET | Freq: Three times a day (TID) | ORAL | 1 refills | Status: AC | PRN

## 2024-03-11 ENCOUNTER — Other Ambulatory Visit: Payer: Self-pay | Admitting: Behavioral Health

## 2024-03-11 DIAGNOSIS — F411 Generalized anxiety disorder: Secondary | ICD-10-CM

## 2024-03-11 DIAGNOSIS — F5105 Insomnia due to other mental disorder: Secondary | ICD-10-CM

## 2024-03-11 NOTE — Telephone Encounter (Signed)
 Morning!  Pt needs appt for Feb.   Thank you

## 2024-03-13 NOTE — Telephone Encounter (Signed)
 Pt is scheduled for 04/19/24

## 2024-03-18 ENCOUNTER — Telehealth: Payer: Self-pay | Admitting: Behavioral Health

## 2024-03-18 ENCOUNTER — Other Ambulatory Visit: Payer: Self-pay

## 2024-03-18 DIAGNOSIS — F902 Attention-deficit hyperactivity disorder, combined type: Secondary | ICD-10-CM

## 2024-03-18 MED ORDER — AMPHETAMINE-DEXTROAMPHETAMINE 20 MG PO TABS: ORAL_TABLET | ORAL | 0 refills | Status: AC

## 2024-03-18 MED ORDER — AMPHETAMINE-DEXTROAMPHET ER 30 MG PO CP24: 30.0000 mg | ORAL_CAPSULE | Freq: Every morning | ORAL | 0 refills | Status: AC

## 2024-04-19 ENCOUNTER — Ambulatory Visit: Admitting: Behavioral Health
